# Patient Record
Sex: Female | Born: 1950
Health system: Southern US, Community
[De-identification: ages and names within clinical notes are randomized; demographics above are authoritative.]

## PROBLEM LIST (undated history)

## (undated) DIAGNOSIS — N841 Polyp of cervix uteri: Secondary | ICD-10-CM

## (undated) DIAGNOSIS — M65331 Trigger finger, right middle finger: Secondary | ICD-10-CM

## (undated) DIAGNOSIS — M65341 Trigger finger, right ring finger: Secondary | ICD-10-CM

## (undated) DIAGNOSIS — Z87898 Personal history of other specified conditions: Secondary | ICD-10-CM

## (undated) DIAGNOSIS — M65351 Trigger finger, right little finger: Secondary | ICD-10-CM

## (undated) DIAGNOSIS — Z9889 Other specified postprocedural states: Secondary | ICD-10-CM

## (undated) DIAGNOSIS — N979 Female infertility, unspecified: Secondary | ICD-10-CM

## (undated) DIAGNOSIS — Z9882 Breast implant status: Secondary | ICD-10-CM

## (undated) DIAGNOSIS — M65321 Trigger finger, right index finger: Secondary | ICD-10-CM

## (undated) DIAGNOSIS — T4145XA Adverse effect of unspecified anesthetic, initial encounter: Secondary | ICD-10-CM

## (undated) DIAGNOSIS — D259 Leiomyoma of uterus, unspecified: Secondary | ICD-10-CM

## (undated) DIAGNOSIS — Z8619 Personal history of other infectious and parasitic diseases: Secondary | ICD-10-CM

## (undated) DIAGNOSIS — N951 Menopausal and female climacteric states: Secondary | ICD-10-CM

## (undated) DIAGNOSIS — M1990 Unspecified osteoarthritis, unspecified site: Secondary | ICD-10-CM

## (undated) DIAGNOSIS — M65311 Trigger thumb, right thumb: Secondary | ICD-10-CM

## (undated) DIAGNOSIS — T8859XA Other complications of anesthesia, initial encounter: Secondary | ICD-10-CM

## (undated) DIAGNOSIS — N879 Dysplasia of cervix uteri, unspecified: Secondary | ICD-10-CM

## (undated) HISTORY — PX: INTRAOCULAR LENS INSERTION: SHX110

## (undated) HISTORY — DX: Dysplasia of cervix uteri, unspecified: N87.9

## (undated) HISTORY — DX: Unspecified osteoarthritis, unspecified site: M19.90

## (undated) HISTORY — PX: UTERINE FIBROID SURGERY: SHX826

## (undated) HISTORY — DX: Menopausal and female climacteric states: N95.1

## (undated) HISTORY — DX: Polyp of cervix uteri: N84.1

## (undated) HISTORY — PX: MOUTH SURGERY: SHX715

## (undated) HISTORY — DX: Leiomyoma of uterus, unspecified: D25.9

## (undated) HISTORY — DX: Personal history of other infectious and parasitic diseases: Z86.19

## (undated) HISTORY — DX: Personal history of other specified conditions: Z87.898

## (undated) HISTORY — PX: TUBAL LIGATION: SHX77

## (undated) HISTORY — DX: Female infertility, unspecified: N97.9

## (undated) HISTORY — PX: ABDOMINAL HYSTERECTOMY: SHX81

---

## 1996-04-15 HISTORY — PX: BREAST SURGERY: SHX581

## 1997-07-27 ENCOUNTER — Other Ambulatory Visit: Admission: RE | Admit: 1997-07-27 | Discharge: 1997-07-27 | Payer: Self-pay | Admitting: *Deleted

## 1998-08-07 ENCOUNTER — Other Ambulatory Visit: Admission: RE | Admit: 1998-08-07 | Discharge: 1998-08-07 | Payer: Self-pay | Admitting: *Deleted

## 1999-10-24 ENCOUNTER — Other Ambulatory Visit: Admission: RE | Admit: 1999-10-24 | Discharge: 1999-10-24 | Payer: Self-pay | Admitting: *Deleted

## 2000-10-02 ENCOUNTER — Other Ambulatory Visit: Admission: RE | Admit: 2000-10-02 | Discharge: 2000-10-02 | Payer: Self-pay | Admitting: *Deleted

## 2001-10-12 ENCOUNTER — Other Ambulatory Visit: Admission: RE | Admit: 2001-10-12 | Discharge: 2001-10-12 | Payer: Self-pay | Admitting: *Deleted

## 2002-12-23 ENCOUNTER — Other Ambulatory Visit: Admission: RE | Admit: 2002-12-23 | Discharge: 2002-12-23 | Payer: Self-pay | Admitting: *Deleted

## 2004-02-21 ENCOUNTER — Emergency Department (HOSPITAL_COMMUNITY): Admission: EM | Admit: 2004-02-21 | Discharge: 2004-02-21 | Payer: Self-pay | Admitting: Emergency Medicine

## 2004-11-22 ENCOUNTER — Other Ambulatory Visit: Admission: RE | Admit: 2004-11-22 | Discharge: 2004-11-22 | Payer: Self-pay | Admitting: Obstetrics and Gynecology

## 2004-12-13 ENCOUNTER — Encounter: Admission: RE | Admit: 2004-12-13 | Discharge: 2004-12-13 | Payer: Self-pay | Admitting: Obstetrics and Gynecology

## 2005-09-26 ENCOUNTER — Encounter: Admission: RE | Admit: 2005-09-26 | Discharge: 2005-09-26 | Payer: Self-pay

## 2006-01-08 ENCOUNTER — Encounter: Admission: RE | Admit: 2006-01-08 | Discharge: 2006-01-08 | Payer: Self-pay | Admitting: Obstetrics and Gynecology

## 2007-01-12 ENCOUNTER — Encounter: Admission: RE | Admit: 2007-01-12 | Discharge: 2007-01-12 | Payer: Self-pay | Admitting: Family Medicine

## 2008-01-13 ENCOUNTER — Encounter: Admission: RE | Admit: 2008-01-13 | Discharge: 2008-01-13 | Payer: Self-pay | Admitting: Obstetrics and Gynecology

## 2008-01-14 DIAGNOSIS — N841 Polyp of cervix uteri: Secondary | ICD-10-CM

## 2008-01-14 HISTORY — DX: Polyp of cervix uteri: N84.1

## 2009-07-24 ENCOUNTER — Encounter: Admission: RE | Admit: 2009-07-24 | Discharge: 2009-07-24 | Payer: Self-pay | Admitting: Obstetrics and Gynecology

## 2009-09-04 ENCOUNTER — Encounter: Admission: RE | Admit: 2009-09-04 | Discharge: 2009-09-04 | Payer: Self-pay | Admitting: Obstetrics and Gynecology

## 2009-09-13 ENCOUNTER — Ambulatory Visit: Admission: RE | Admit: 2009-09-13 | Discharge: 2009-09-13 | Payer: Self-pay | Admitting: Gynecologic Oncology

## 2009-09-19 ENCOUNTER — Inpatient Hospital Stay (HOSPITAL_COMMUNITY): Admission: RE | Admit: 2009-09-19 | Discharge: 2009-09-22 | Payer: Self-pay | Admitting: Obstetrics & Gynecology

## 2009-09-19 ENCOUNTER — Encounter: Payer: Self-pay | Admitting: Obstetrics & Gynecology

## 2009-09-19 HISTORY — PX: SALPINGOOPHORECTOMY: SHX82

## 2009-09-26 ENCOUNTER — Inpatient Hospital Stay (HOSPITAL_COMMUNITY): Admission: EM | Admit: 2009-09-26 | Discharge: 2009-10-06 | Payer: Self-pay | Admitting: Emergency Medicine

## 2010-05-06 ENCOUNTER — Encounter: Payer: Self-pay | Admitting: Obstetrics and Gynecology

## 2010-07-01 LAB — BASIC METABOLIC PANEL
BUN: 1 mg/dL — ABNORMAL LOW (ref 6–23)
BUN: 2 mg/dL — ABNORMAL LOW (ref 6–23)
BUN: 2 mg/dL — ABNORMAL LOW (ref 6–23)
BUN: 3 mg/dL — ABNORMAL LOW (ref 6–23)
BUN: 5 mg/dL — ABNORMAL LOW (ref 6–23)
BUN: 6 mg/dL (ref 6–23)
CO2: 27 mEq/L (ref 19–32)
CO2: 27 mEq/L (ref 19–32)
CO2: 27 mEq/L (ref 19–32)
CO2: 28 mEq/L (ref 19–32)
CO2: 30 mEq/L (ref 19–32)
CO2: 30 mEq/L (ref 19–32)
Calcium: 9.1 mg/dL (ref 8.4–10.5)
Calcium: 9.2 mg/dL (ref 8.4–10.5)
Calcium: 9.4 mg/dL (ref 8.4–10.5)
Calcium: 9.5 mg/dL (ref 8.4–10.5)
Calcium: 9.5 mg/dL (ref 8.4–10.5)
Calcium: 9.7 mg/dL (ref 8.4–10.5)
Chloride: 101 mEq/L (ref 96–112)
Chloride: 101 mEq/L (ref 96–112)
Chloride: 103 mEq/L (ref 96–112)
Chloride: 103 mEq/L (ref 96–112)
Chloride: 106 mEq/L (ref 96–112)
Chloride: 98 mEq/L (ref 96–112)
Creatinine, Ser: 0.62 mg/dL (ref 0.4–1.2)
Creatinine, Ser: 0.7 mg/dL (ref 0.4–1.2)
Creatinine, Ser: 0.7 mg/dL (ref 0.4–1.2)
Creatinine, Ser: 0.71 mg/dL (ref 0.4–1.2)
Creatinine, Ser: 0.75 mg/dL (ref 0.4–1.2)
Creatinine, Ser: 0.77 mg/dL (ref 0.4–1.2)
GFR calc Af Amer: >60 mL/min (ref >60)
GFR calc Af Amer: >60 mL/min (ref >60)
GFR calc Af Amer: >60 mL/min (ref >60)
GFR calc Af Amer: >60 mL/min (ref >60)
GFR calc Af Amer: >60 mL/min (ref >60)
GFR calc Af Amer: >60 mL/min (ref >60)
GFR calc non Af Amer: >60 mL/min (ref >60)
GFR calc non Af Amer: >60 mL/min (ref >60)
GFR calc non Af Amer: >60 mL/min (ref >60)
GFR calc non Af Amer: >60 mL/min (ref >60)
GFR calc non Af Amer: >60 mL/min (ref >60)
GFR calc non Af Amer: >60 mL/min (ref >60)
Glucose, Bld: 114 mg/dL — ABNORMAL HIGH (ref 70–99)
Glucose, Bld: 115 mg/dL — ABNORMAL HIGH (ref 70–99)
Glucose, Bld: 117 mg/dL — ABNORMAL HIGH (ref 70–99)
Glucose, Bld: 118 mg/dL — ABNORMAL HIGH (ref 70–99)
Glucose, Bld: 128 mg/dL — ABNORMAL HIGH (ref 70–99)
Glucose, Bld: 136 mg/dL — ABNORMAL HIGH (ref 70–99)
Potassium: 4 mEq/L (ref 3.5–5.1)
Potassium: 4 mEq/L (ref 3.5–5.1)
Potassium: 4.1 mEq/L (ref 3.5–5.1)
Potassium: 4.1 mEq/L (ref 3.5–5.1)
Potassium: 4.5 mEq/L (ref 3.5–5.1)
Potassium: 4.6 mEq/L (ref 3.5–5.1)
Sodium: 134 mEq/L — ABNORMAL LOW (ref 135–145)
Sodium: 136 mEq/L (ref 135–145)
Sodium: 137 mEq/L (ref 135–145)
Sodium: 138 mEq/L (ref 135–145)
Sodium: 138 mEq/L (ref 135–145)
Sodium: 138 mEq/L (ref 135–145)

## 2010-07-01 LAB — CBC
HCT: 33 % — ABNORMAL LOW (ref 36.0–46.0)
HCT: 33 % — ABNORMAL LOW (ref 36.0–46.0)
HCT: 34.1 % — ABNORMAL LOW (ref 36.0–46.0)
HCT: 34.7 % — ABNORMAL LOW (ref 36.0–46.0)
HCT: 34.8 % — ABNORMAL LOW (ref 36.0–46.0)
HCT: 35 % — ABNORMAL LOW (ref 36.0–46.0)
HCT: 35.2 % — ABNORMAL LOW (ref 36.0–46.0)
Hemoglobin: 10.8 g/dL — ABNORMAL LOW (ref 12.0–15.0)
Hemoglobin: 10.9 g/dL — ABNORMAL LOW (ref 12.0–15.0)
Hemoglobin: 11.2 g/dL — ABNORMAL LOW (ref 12.0–15.0)
Hemoglobin: 11.2 g/dL — ABNORMAL LOW (ref 12.0–15.0)
Hemoglobin: 11.3 g/dL — ABNORMAL LOW (ref 12.0–15.0)
Hemoglobin: 11.5 g/dL — ABNORMAL LOW (ref 12.0–15.0)
Hemoglobin: 11.6 g/dL — ABNORMAL LOW (ref 12.0–15.0)
MCHC: 31.9 g/dL (ref 30.0–36.0)
MCHC: 32.1 g/dL (ref 30.0–36.0)
MCHC: 32.3 g/dL (ref 30.0–36.0)
MCHC: 32.6 g/dL (ref 30.0–36.0)
MCHC: 32.6 g/dL (ref 30.0–36.0)
MCHC: 32.7 g/dL (ref 30.0–36.0)
MCHC: 35 g/dL (ref 30.0–36.0)
MCV: 84.7 fL (ref 78.0–100.0)
MCV: 86.5 fL (ref 78.0–100.0)
MCV: 86.7 fL (ref 78.0–100.0)
MCV: 86.9 fL (ref 78.0–100.0)
MCV: 87 fL (ref 78.0–100.0)
MCV: 87.7 fL (ref 78.0–100.0)
MCV: 88 fL (ref 78.0–100.0)
Platelets: 282 10*3/uL (ref 150–400)
Platelets: 288 10*3/uL (ref 150–400)
Platelets: 292 10*3/uL (ref 150–400)
Platelets: 307 10*3/uL (ref 150–400)
Platelets: 316 10*3/uL (ref 150–400)
Platelets: 339 10*3/uL (ref 150–400)
Platelets: 347 10*3/uL (ref 150–400)
RBC: 3.81 MIL/uL — ABNORMAL LOW (ref 3.87–5.11)
RBC: 3.89 MIL/uL (ref 3.87–5.11)
RBC: 3.9 MIL/uL (ref 3.87–5.11)
RBC: 3.98 MIL/uL (ref 3.87–5.11)
RBC: 3.99 MIL/uL (ref 3.87–5.11)
RBC: 4 MIL/uL (ref 3.87–5.11)
RBC: 4.07 MIL/uL (ref 3.87–5.11)
RDW: 12 % (ref 11.5–15.5)
RDW: 12.9 % (ref 11.5–15.5)
RDW: 13 % (ref 11.5–15.5)
RDW: 13 % (ref 11.5–15.5)
RDW: 13.1 % (ref 11.5–15.5)
RDW: 13.2 % (ref 11.5–15.5)
RDW: 13.5 % (ref 11.5–15.5)
WBC: 4 10*3/uL (ref 4.0–10.5)
WBC: 4.4 10*3/uL (ref 4.0–10.5)
WBC: 4.4 10*3/uL (ref 4.0–10.5)
WBC: 5.2 10*3/uL (ref 4.0–10.5)
WBC: 5.2 10*3/uL (ref 4.0–10.5)
WBC: 6.8 10*3/uL (ref 4.0–10.5)
WBC: 7.1 10*3/uL (ref 4.0–10.5)

## 2010-07-01 LAB — COMPREHENSIVE METABOLIC PANEL
ALT: 13 U/L (ref 0–35)
ALT: 14 U/L (ref 0–35)
AST: 15 U/L (ref 0–37)
AST: 18 U/L (ref 0–37)
Albumin: 3.2 g/dL — ABNORMAL LOW (ref 3.5–5.2)
Albumin: 3.2 g/dL — ABNORMAL LOW (ref 3.5–5.2)
Alkaline Phosphatase: 43 U/L (ref 39–117)
Alkaline Phosphatase: 43 U/L (ref 39–117)
BUN: 2 mg/dL — ABNORMAL LOW (ref 6–23)
BUN: 9 mg/dL (ref 6–23)
CO2: 28 mEq/L (ref 19–32)
CO2: 30 mEq/L (ref 19–32)
Calcium: 9.1 mg/dL (ref 8.4–10.5)
Calcium: 9.2 mg/dL (ref 8.4–10.5)
Chloride: 101 mEq/L (ref 96–112)
Chloride: 102 mEq/L (ref 96–112)
Creatinine, Ser: 0.6 mg/dL (ref 0.4–1.2)
Creatinine, Ser: 0.78 mg/dL (ref 0.4–1.2)
GFR calc Af Amer: >60 mL/min (ref >60)
GFR calc Af Amer: >60 mL/min (ref >60)
GFR calc non Af Amer: >60 mL/min (ref >60)
GFR calc non Af Amer: >60 mL/min (ref >60)
Glucose, Bld: 118 mg/dL — ABNORMAL HIGH (ref 70–99)
Glucose, Bld: 140 mg/dL — ABNORMAL HIGH (ref 70–99)
Potassium: 4 mEq/L (ref 3.5–5.1)
Potassium: 4.1 mEq/L (ref 3.5–5.1)
Sodium: 136 mEq/L (ref 135–145)
Sodium: 137 mEq/L (ref 135–145)
Total Bilirubin: 0.2 mg/dL — ABNORMAL LOW (ref 0.3–1.2)
Total Bilirubin: 0.7 mg/dL (ref 0.3–1.2)
Total Protein: 6.2 g/dL (ref 6.0–8.3)
Total Protein: 6.3 g/dL (ref 6.0–8.3)

## 2010-07-01 LAB — TYPE AND SCREEN
ABO/RH(D): A POS
Antibody Screen: NEGATIVE

## 2010-07-02 LAB — DIFFERENTIAL
Basophils Absolute: 0 10*3/uL (ref 0.0–0.1)
Basophils Absolute: 0 10*3/uL (ref 0.0–0.1)
Basophils Relative: 0 % (ref 0–1)
Basophils Relative: 0 % (ref 0–1)
Eosinophils Absolute: 0 10*3/uL (ref 0.0–0.7)
Eosinophils Absolute: 0 10*3/uL (ref 0.0–0.7)
Eosinophils Relative: 0 % (ref 0–5)
Eosinophils Relative: 1 % (ref 0–5)
Lymphocytes Relative: 13 % (ref 12–46)
Lymphocytes Relative: 24 % (ref 12–46)
Lymphs Abs: 0.6 10*3/uL — ABNORMAL LOW (ref 0.7–4.0)
Lymphs Abs: 1.1 10*3/uL (ref 0.7–4.0)
Monocytes Absolute: 0.3 10*3/uL (ref 0.1–1.0)
Monocytes Absolute: 0.7 10*3/uL (ref 0.1–1.0)
Monocytes Relative: 15 % — ABNORMAL HIGH (ref 3–12)
Monocytes Relative: 5 % (ref 3–12)
Neutro Abs: 3.3 10*3/uL (ref 1.7–7.7)
Neutro Abs: 3.4 10*3/uL (ref 1.7–7.7)
Neutrophils Relative %: 70 % (ref 43–77)
Neutrophils Relative %: 71 % (ref 43–77)

## 2010-07-02 LAB — CBC
HCT: 34 % — ABNORMAL LOW (ref 36.0–46.0)
HCT: 39 % (ref 36.0–46.0)
HCT: 39.6 % (ref 36.0–46.0)
Hemoglobin: 11.2 g/dL — ABNORMAL LOW (ref 12.0–15.0)
Hemoglobin: 12.9 g/dL (ref 12.0–15.0)
Hemoglobin: 13 g/dL (ref 12.0–15.0)
MCHC: 32.5 g/dL (ref 30.0–36.0)
MCHC: 32.9 g/dL (ref 30.0–36.0)
MCHC: 33.3 g/dL (ref 30.0–36.0)
MCV: 87.4 fL (ref 78.0–100.0)
MCV: 88.1 fL (ref 78.0–100.0)
MCV: 89 fL (ref 78.0–100.0)
Platelets: 227 10*3/uL (ref 150–400)
Platelets: 228 10*3/uL (ref 150–400)
Platelets: 318 10*3/uL (ref 150–400)
RBC: 3.82 MIL/uL — ABNORMAL LOW (ref 3.87–5.11)
RBC: 4.43 MIL/uL (ref 3.87–5.11)
RBC: 4.53 MIL/uL (ref 3.87–5.11)
RDW: 13 % (ref 11.5–15.5)
RDW: 13.6 % (ref 11.5–15.5)
RDW: 13.7 % (ref 11.5–15.5)
WBC: 4.6 10*3/uL (ref 4.0–10.5)
WBC: 4.8 10*3/uL (ref 4.0–10.5)
WBC: 7.1 10*3/uL (ref 4.0–10.5)

## 2010-07-02 LAB — URINALYSIS, ROUTINE W REFLEX MICROSCOPIC
Glucose, UA: NEGATIVE mg/dL
Ketones, ur: >80 mg/dL — AB
Leukocytes, UA: NEGATIVE
Nitrite: NEGATIVE
Protein, ur: 100 mg/dL — AB
Specific Gravity, Urine: 1.034 — ABNORMAL HIGH (ref 1.005–1.030)
Urobilinogen, UA: 1 mg/dL (ref 0.0–1.0)
pH: 5.5 (ref 5.0–8.0)

## 2010-07-02 LAB — COMPREHENSIVE METABOLIC PANEL
ALT: 16 U/L (ref 0–35)
ALT: 18 U/L (ref 0–35)
AST: 20 U/L (ref 0–37)
AST: 28 U/L (ref 0–37)
Albumin: 4 g/dL (ref 3.5–5.2)
Albumin: 4.2 g/dL (ref 3.5–5.2)
Alkaline Phosphatase: 43 U/L (ref 39–117)
Alkaline Phosphatase: 53 U/L (ref 39–117)
BUN: 10 mg/dL (ref 6–23)
BUN: 8 mg/dL (ref 6–23)
CO2: 28 mEq/L (ref 19–32)
CO2: 30 mEq/L (ref 19–32)
Calcium: 10 mg/dL (ref 8.4–10.5)
Calcium: 10.1 mg/dL (ref 8.4–10.5)
Chloride: 107 mEq/L (ref 96–112)
Chloride: 98 mEq/L (ref 96–112)
Creatinine, Ser: 0.76 mg/dL (ref 0.4–1.2)
Creatinine, Ser: 0.9 mg/dL (ref 0.4–1.2)
GFR calc Af Amer: >60 mL/min (ref >60)
GFR calc Af Amer: >60 mL/min (ref >60)
GFR calc non Af Amer: >60 mL/min (ref >60)
GFR calc non Af Amer: >60 mL/min (ref >60)
Glucose, Bld: 100 mg/dL — ABNORMAL HIGH (ref 70–99)
Glucose, Bld: 71 mg/dL (ref 70–99)
Potassium: 4.3 mEq/L (ref 3.5–5.1)
Potassium: 4.4 mEq/L (ref 3.5–5.1)
Sodium: 137 mEq/L (ref 135–145)
Sodium: 143 mEq/L (ref 135–145)
Total Bilirubin: 0.5 mg/dL (ref 0.3–1.2)
Total Bilirubin: 0.6 mg/dL (ref 0.3–1.2)
Total Protein: 7.4 g/dL (ref 6.0–8.3)
Total Protein: 7.6 g/dL (ref 6.0–8.3)

## 2010-07-02 LAB — TYPE AND SCREEN
ABO/RH(D): A POS
Antibody Screen: NEGATIVE

## 2010-07-02 LAB — BASIC METABOLIC PANEL
BUN: 4 mg/dL — ABNORMAL LOW (ref 6–23)
CO2: 27 mEq/L (ref 19–32)
Calcium: 8.8 mg/dL (ref 8.4–10.5)
Chloride: 106 mEq/L (ref 96–112)
Creatinine, Ser: 0.77 mg/dL (ref 0.4–1.2)
GFR calc Af Amer: >60 mL/min (ref >60)
GFR calc non Af Amer: >60 mL/min (ref >60)
Glucose, Bld: 125 mg/dL — ABNORMAL HIGH (ref 70–99)
Potassium: 4.7 mEq/L (ref 3.5–5.1)
Sodium: 139 mEq/L (ref 135–145)

## 2010-07-02 LAB — URINE MICROSCOPIC-ADD ON

## 2010-07-02 LAB — LIPASE, BLOOD: Lipase: 85 U/L — ABNORMAL HIGH (ref 11–59)

## 2010-07-02 LAB — ABO/RH: ABO/RH(D): A POS

## 2010-08-13 ENCOUNTER — Other Ambulatory Visit: Payer: Self-pay | Admitting: Obstetrics and Gynecology

## 2010-08-13 DIAGNOSIS — Z1231 Encounter for screening mammogram for malignant neoplasm of breast: Secondary | ICD-10-CM

## 2010-08-15 ENCOUNTER — Ambulatory Visit
Admission: RE | Admit: 2010-08-15 | Discharge: 2010-08-15 | Disposition: A | Discharge status: Home / Self Care | Payer: BC Managed Care – PPO | Source: Ambulatory Visit | Attending: Obstetrics and Gynecology | Admitting: Obstetrics and Gynecology

## 2010-08-15 DIAGNOSIS — Z1231 Encounter for screening mammogram for malignant neoplasm of breast: Secondary | ICD-10-CM

## 2010-09-12 ENCOUNTER — Ambulatory Visit (AMBULATORY_SURGERY_CENTER): Payer: BC Managed Care – PPO | Admitting: *Deleted

## 2010-09-12 ENCOUNTER — Encounter: Payer: Self-pay | Admitting: Gastroenterology

## 2010-09-12 VITALS — Ht 66.5 in | Wt 164.8 lb

## 2010-09-12 DIAGNOSIS — Z1211 Encounter for screening for malignant neoplasm of colon: Secondary | ICD-10-CM

## 2010-09-12 MED ORDER — PEG-KCL-NACL-NASULF-NA ASC-C 100 G PO SOLR: ORAL | Status: DC

## 2010-09-18 ENCOUNTER — Ambulatory Visit (AMBULATORY_SURGERY_CENTER): Payer: BC Managed Care – PPO | Admitting: Gastroenterology

## 2010-09-18 ENCOUNTER — Encounter: Payer: Self-pay | Admitting: Gastroenterology

## 2010-09-18 VITALS — BP 113/67 | HR 51 | Temp 95.9°F | Resp 14 | Ht 67.5 in | Wt 162.0 lb

## 2010-09-18 DIAGNOSIS — K5730 Diverticulosis of large intestine without perforation or abscess without bleeding: Secondary | ICD-10-CM

## 2010-09-18 DIAGNOSIS — Z139 Encounter for screening, unspecified: Secondary | ICD-10-CM

## 2010-09-18 DIAGNOSIS — Z1211 Encounter for screening for malignant neoplasm of colon: Secondary | ICD-10-CM

## 2010-09-18 MED ORDER — SODIUM CHLORIDE 0.9 % IV SOLN: 500.0000 mL | INTRAVENOUS | Status: DC

## 2010-09-18 NOTE — Patient Instructions (Signed)
Diverticulosis Diverticulosis is a common condition that develops when small pouches (diverticula) form in the wall of the colon. The risk of diverticulosis increases with age. It happens more often in people who eat a low-fiber diet. Most individuals with diverticulosis have no symptoms. Those individuals with symptoms usually experience belly (abdominal) pain, constipation, or loose stools (diarrhea). HOME CARE INSTRUCTIONS  Increase the amount of fiber in your diet as directed by your caregiver or dietician. This may reduce symptoms of diverticulosis.   Your caregiver may recommend taking a dietary fiber supplement.   Drink at least 6 to 8 glasses of water each day to prevent constipation.   Try not to strain when you have a bowel movement.   Your caregiver may recommend avoiding nuts and seeds to prevent complications, although this is still an uncertain benefit.   Only take over-the-counter or prescription medicines for pain, discomfort, or fever as directed by your caregiver.  FOODS HAVING HIGH FIBER CONTENT INCLUDE:  Fruits. Apple, peach, pear, tangerine, raisins, prunes.   Vegetables. Brussels sprouts, asparagus, broccoli, cabbage, carrot, cauliflower, romaine lettuce, spinach, summer squash, tomato, winter squash, zucchini.   Starchy Vegetables. Baked beans, kidney beans, lima beans, split peas, lentils, potatoes (with skin).   Grains. Whole wheat bread, brown rice, bran flake cereal, plain oatmeal, white rice, shredded wheat, bran muffins.  SEEK IMMEDIATE MEDICAL CARE IF:  You develop increasing pain or severe bloating.   You have an oral temperature above 100, not controlled by medicine.   You develop vomiting or bowel movements that are bloody or black.  Document Released: 12/28/2003 Document Re-Released: 09/19/2009 [employer] Patient Information 2011 [employer]. 

## 2010-09-18 NOTE — Progress Notes (Signed)
Discharged pt from monitor before printing vitals. BP 113-70---85/55, hr 48-55.

## 2010-09-19 ENCOUNTER — Telehealth: Payer: Self-pay | Admitting: *Deleted

## 2010-09-19 NOTE — Telephone Encounter (Signed)

## 2011-02-02 IMAGING — CR DG ABDOMEN 2V
2 series · 2 of 2 positions shown · non-contrast
Comparison: 10/01/2009

CLINICAL DATA: Small bowel obstruction

ABDOMEN - 2 VIEW

[w abdomen upright]
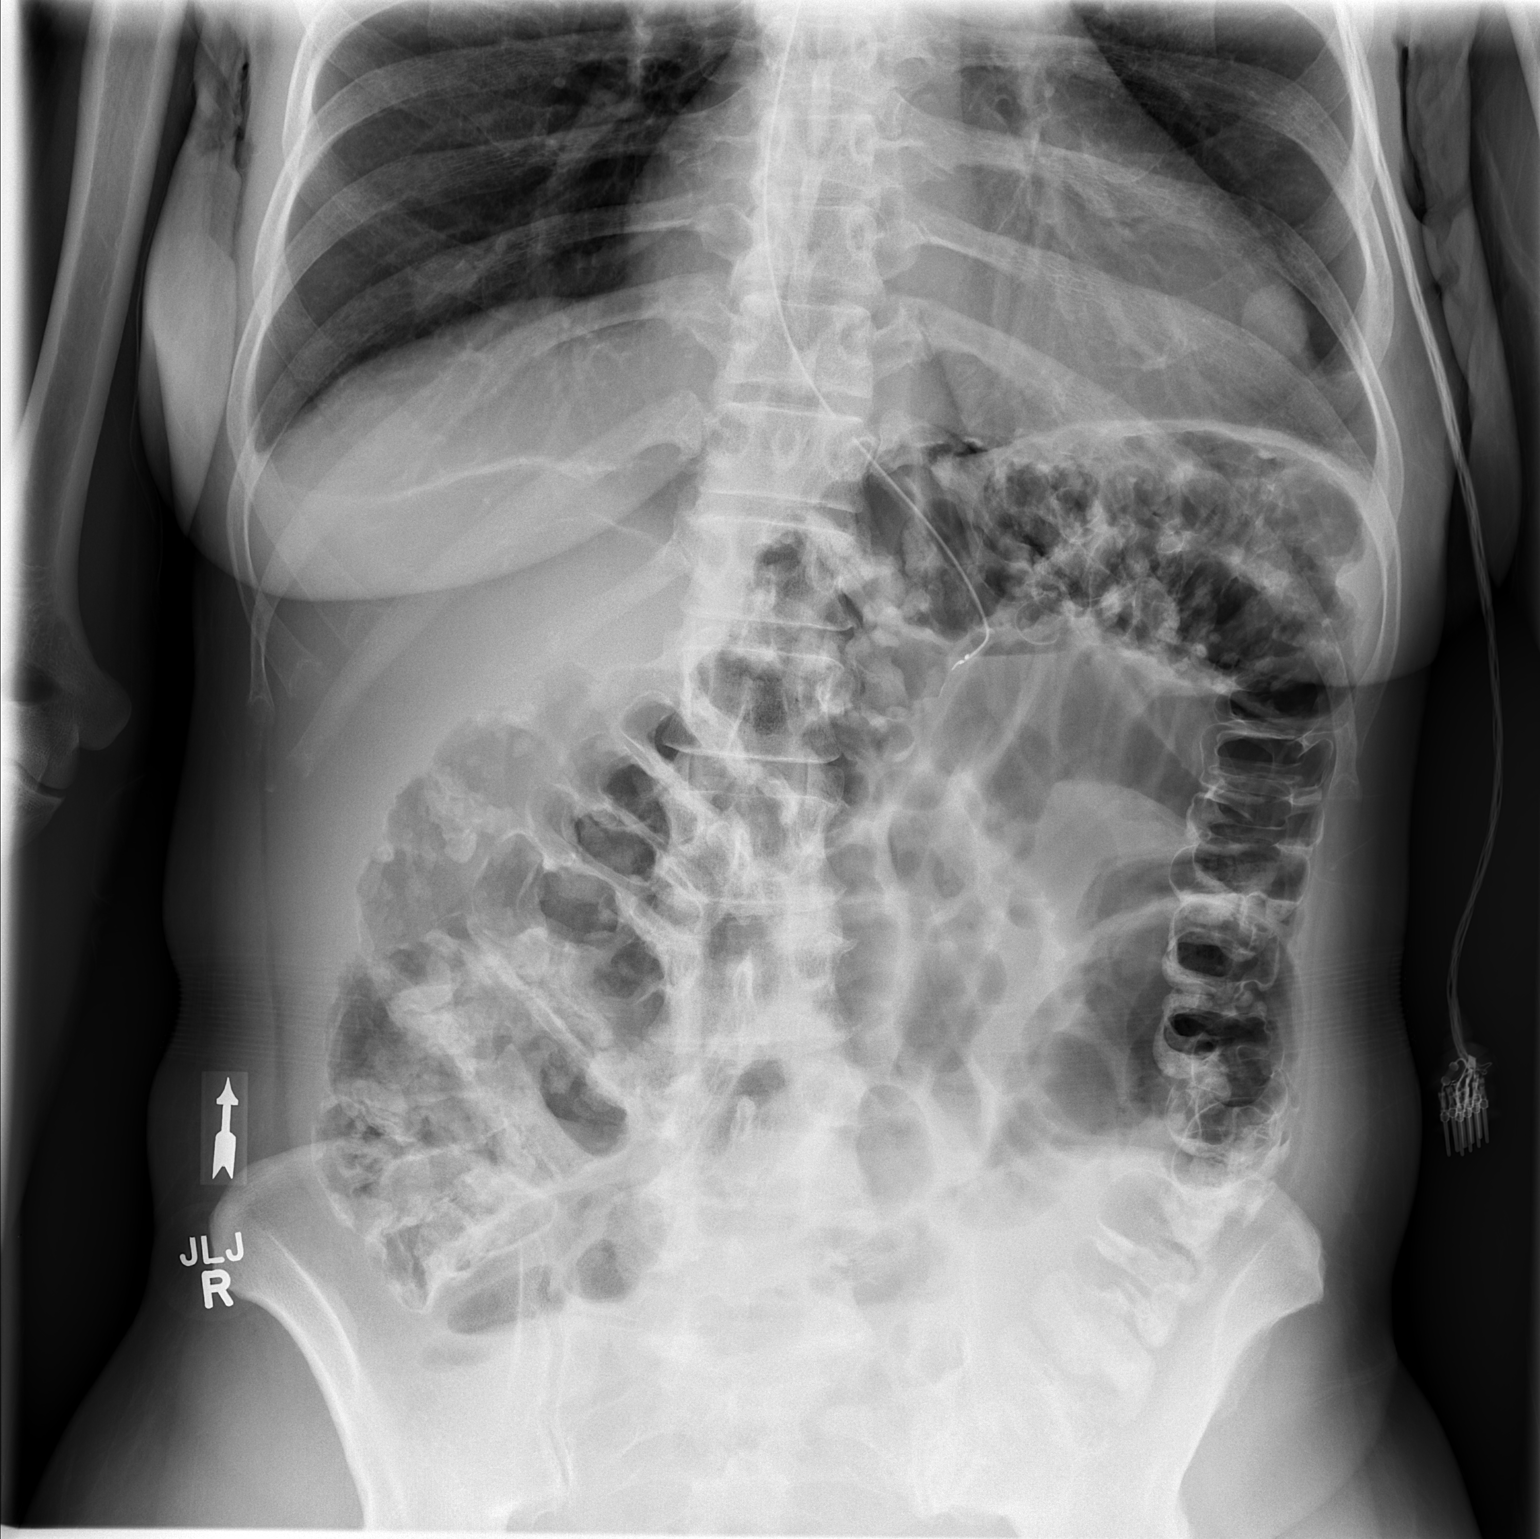

[t abdomen supine]
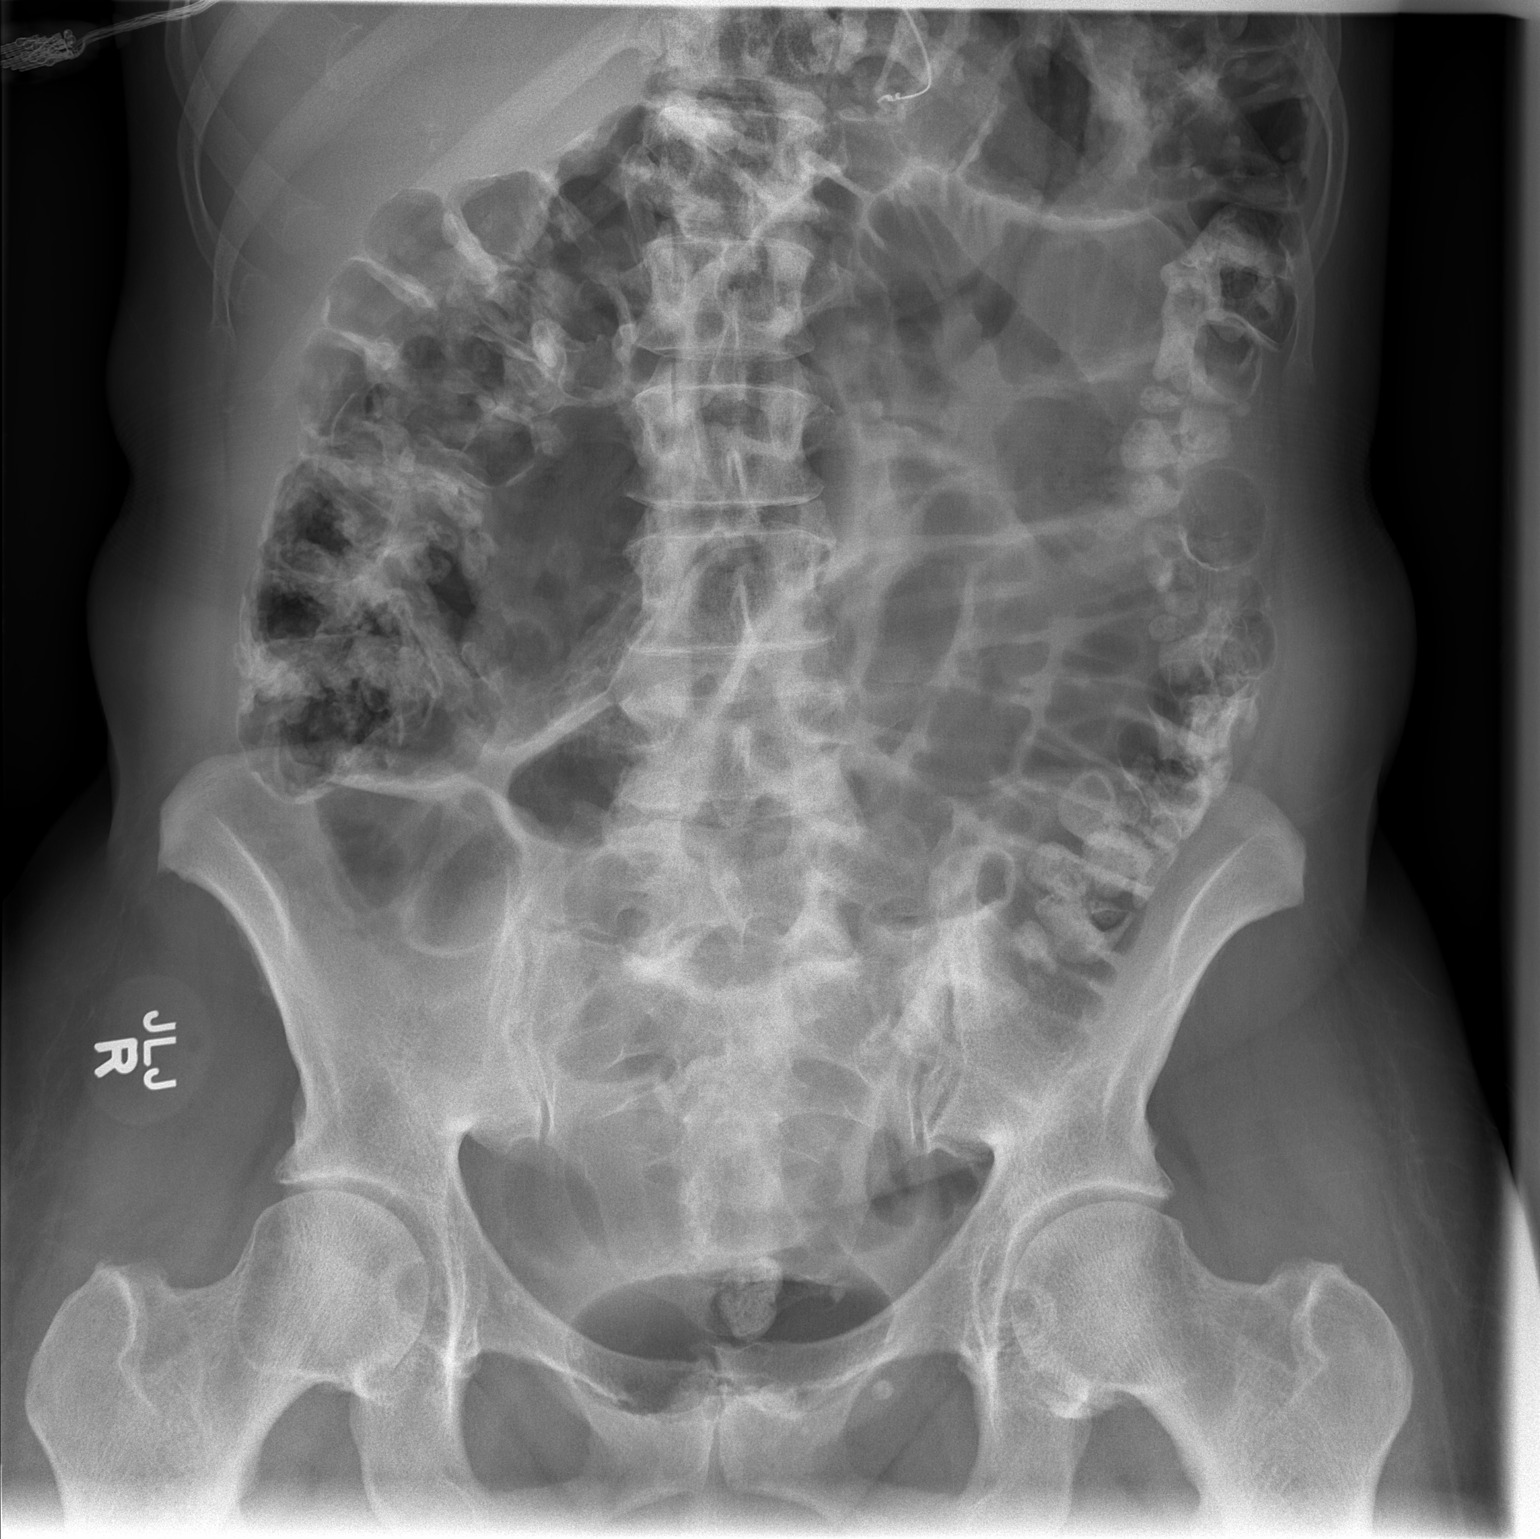

[2 of 2 positions shown; findings below may reference images not displayed]

FINDINGS: Stable NG tube position in the proximal stomach.
Persistent gaseous distention of small bowel.  Contrast and air
also present in the colon.  Interval improvement in the number of
air fluid levels on the upright exam.  No free air.
IMPRESSION: Improving small bowel obstruction pattern

## 2011-02-03 IMAGING — CR DG ABDOMEN 2V
3 series · 3 of 3 positions shown · non-contrast
Comparison: 10/02/2009.

CLINICAL DATA: Small-bowel obstruction.  Follow-up.

ABDOMEN - 2 VIEW

[w abdomen upright *]
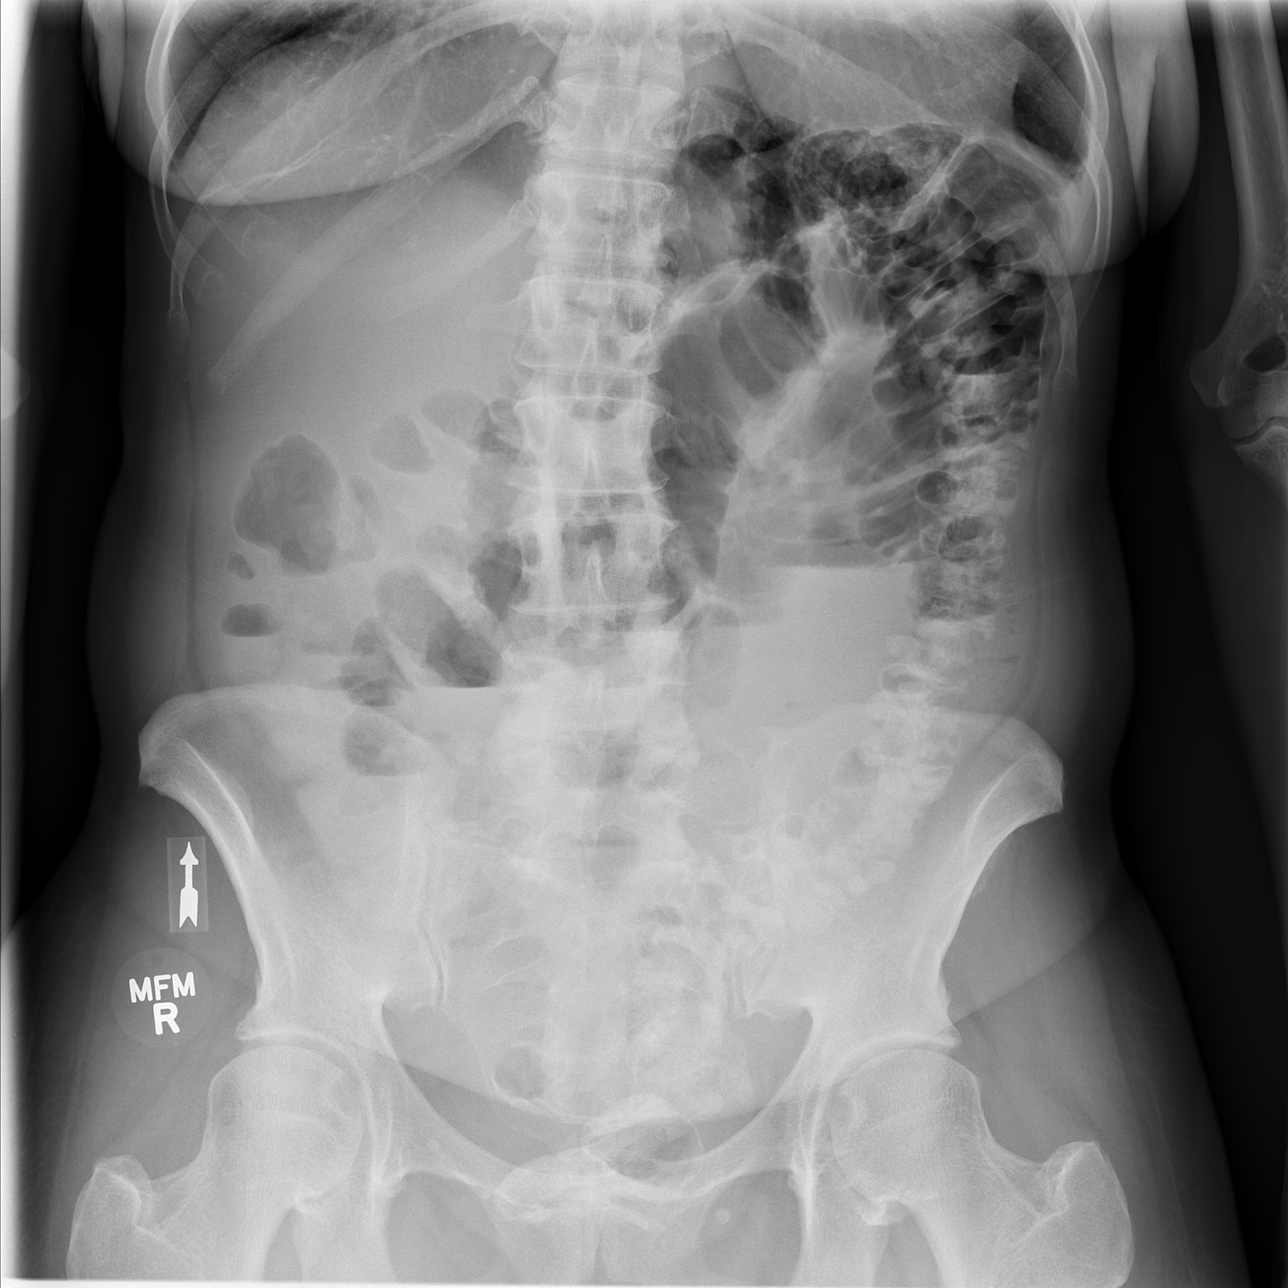

[w abdomen upright]
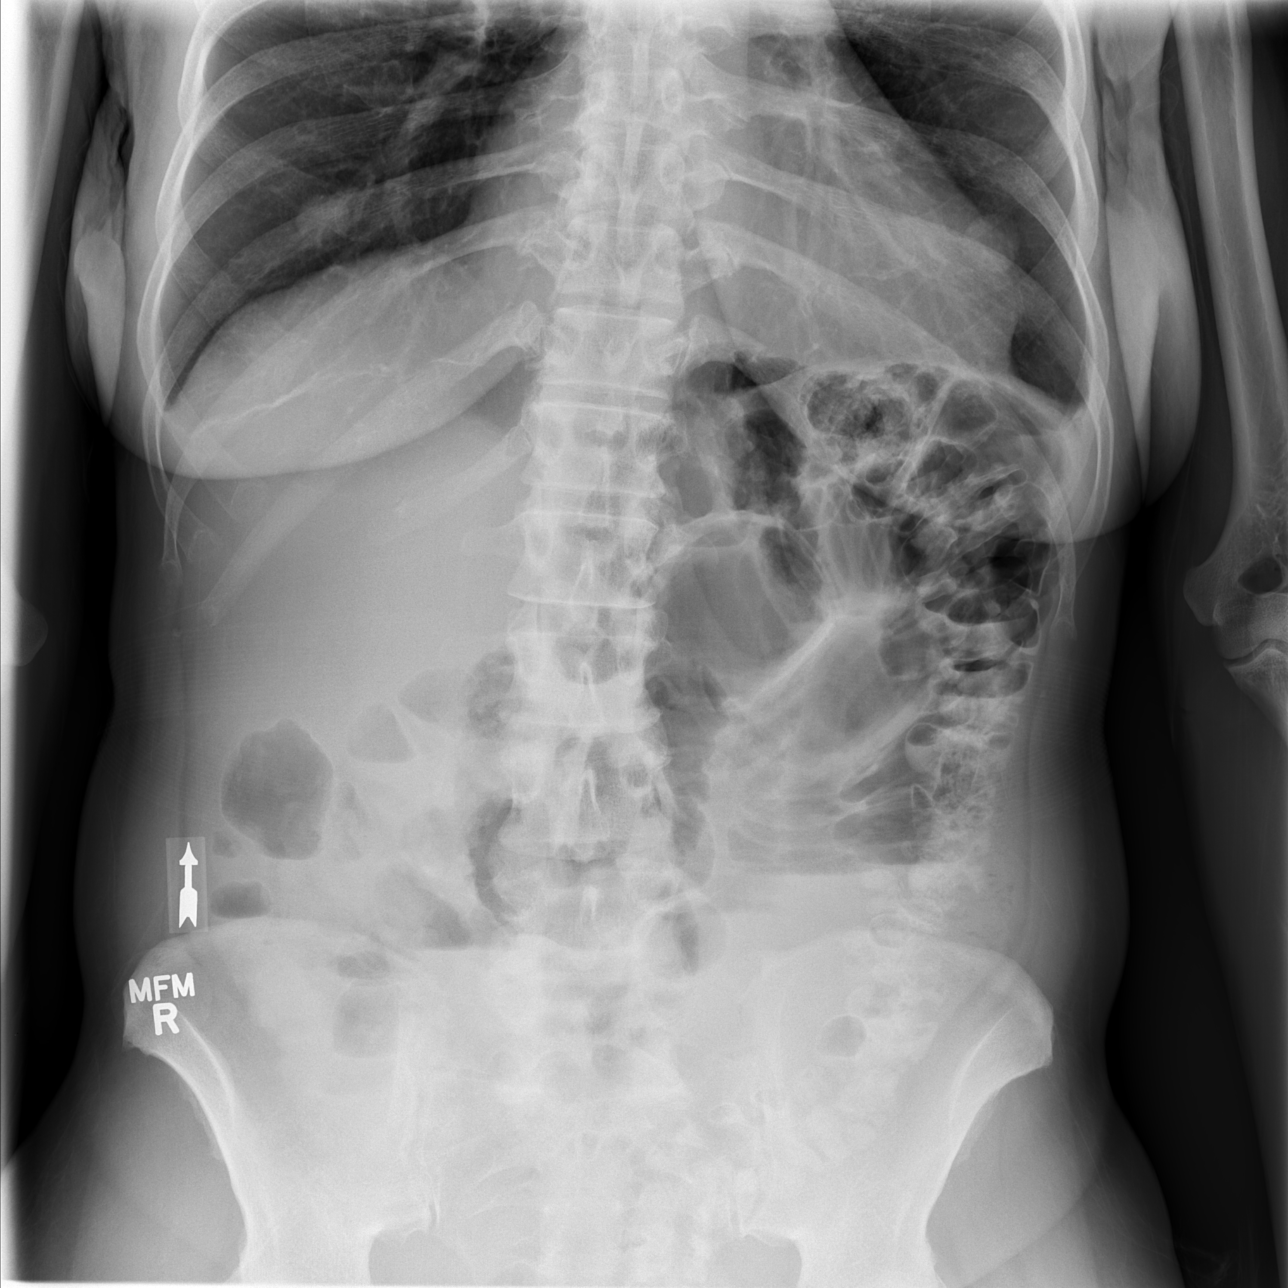

[t abdomen supine]
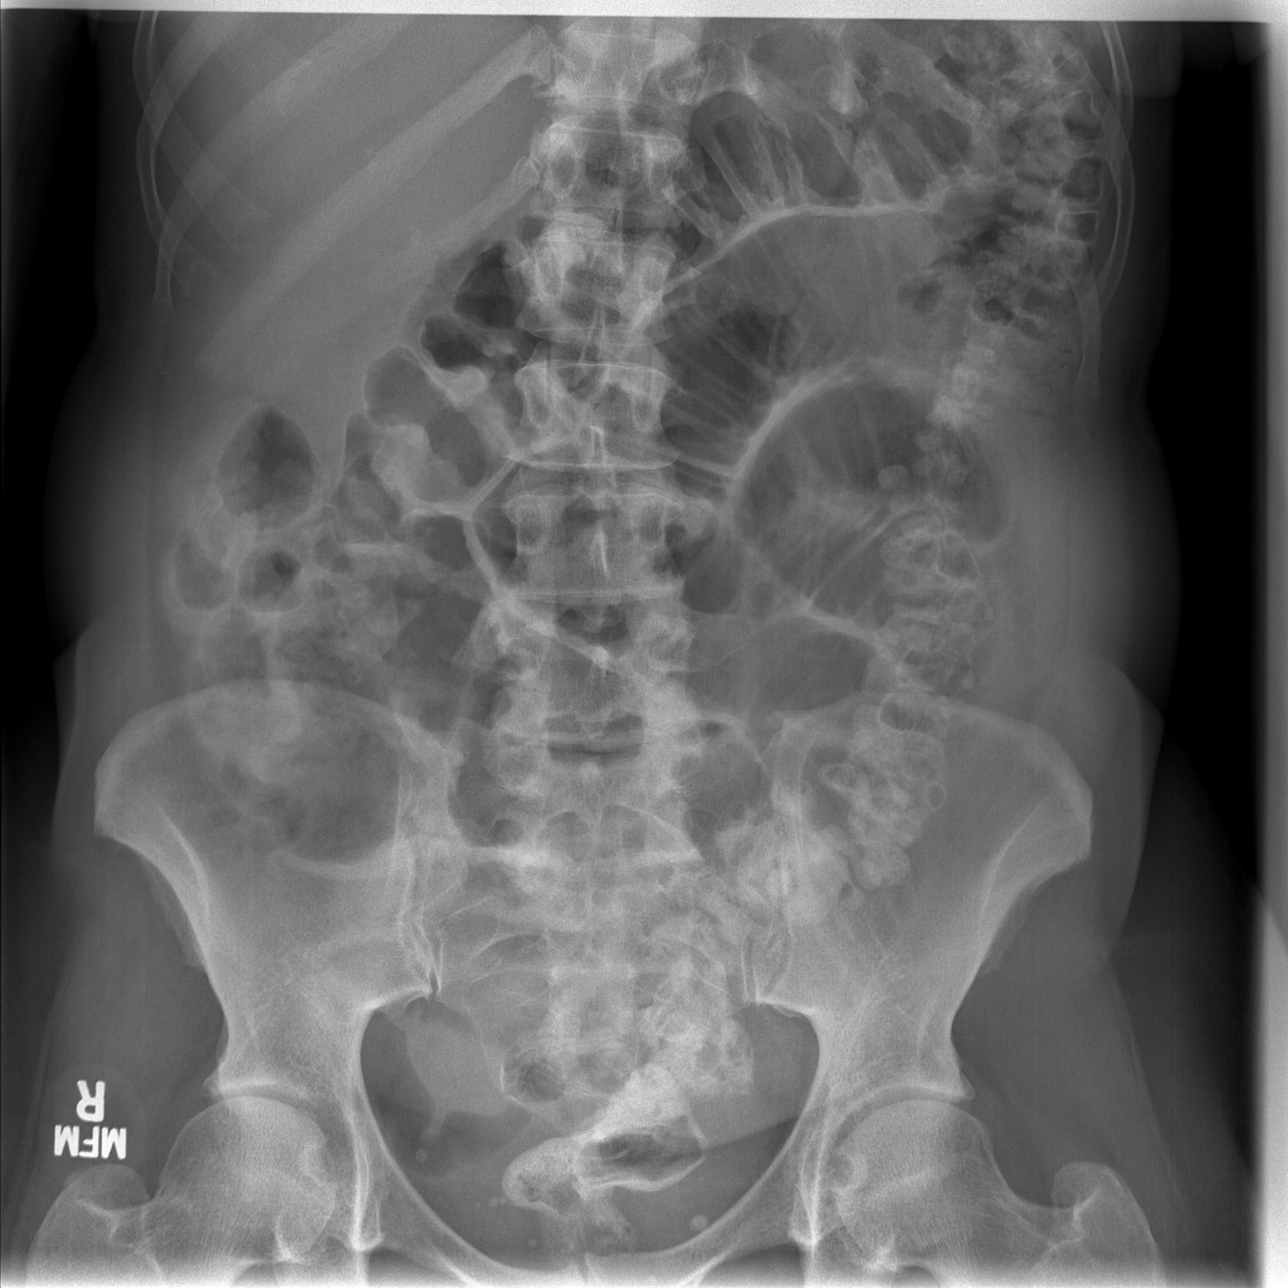

[3 of 3 positions shown; findings below may reference images not displayed]

FINDINGS: Since the previous examination the enteric tube has been
removed.  No pneumoperitoneum is evident.  Colon gas and contrast
is present.  Contrast and air is seen in the rectum.  There is
dilatation of multiple loops of small intestine with air-fluid
levels on upright examination.  The largest loop has a caliber of
approximately 5 cm.  Fewer loops appear distended on the current
examination.
IMPRESSION: Evidence of partial mechanical small-bowel obstruction.  Fewer
loops appear distended but there is still significant dilatation of
small bowel with air-fluid levels.  Interval removal of enteric
tube.  No pneumoperitoneum is evident.

## 2011-07-30 ENCOUNTER — Other Ambulatory Visit: Payer: Self-pay | Admitting: Obstetrics and Gynecology

## 2011-07-30 DIAGNOSIS — Z1231 Encounter for screening mammogram for malignant neoplasm of breast: Secondary | ICD-10-CM

## 2011-08-01 ENCOUNTER — Ambulatory Visit: Payer: Self-pay | Admitting: Obstetrics and Gynecology

## 2011-08-13 ENCOUNTER — Ambulatory Visit: Payer: Self-pay | Admitting: Obstetrics and Gynecology

## 2011-08-21 ENCOUNTER — Ambulatory Visit
Admission: RE | Admit: 2011-08-21 | Discharge: 2011-08-21 | Disposition: A | Discharge status: Home / Self Care | Payer: BC Managed Care – PPO | Source: Ambulatory Visit | Attending: Obstetrics and Gynecology | Admitting: Obstetrics and Gynecology

## 2011-08-21 DIAGNOSIS — Z1231 Encounter for screening mammogram for malignant neoplasm of breast: Secondary | ICD-10-CM

## 2011-08-30 ENCOUNTER — Ambulatory Visit (INDEPENDENT_AMBULATORY_CARE_PROVIDER_SITE_OTHER): Payer: BC Managed Care – PPO | Admitting: Obstetrics and Gynecology

## 2011-08-30 ENCOUNTER — Encounter: Payer: Self-pay | Admitting: Obstetrics and Gynecology

## 2011-08-30 VITALS — BP 100/64 | Ht 67.0 in | Wt 170.0 lb

## 2011-08-30 DIAGNOSIS — Z01419 Encounter for gynecological examination (general) (routine) without abnormal findings: Secondary | ICD-10-CM

## 2011-08-30 NOTE — Progress Notes (Signed)
Contraception Hyst Last pap 07/27/2010 wnl Last Colonoscopy /09/18/2010 Diverticulitis Last Dexa Scan 08/2009 wnl Primary MD none Abuse at Home none Last Mammo 08/2011 wnl  No complaints  Filed Vitals:   08/30/11 1518  BP: 100/64   ROS: noncontributory  Physical Examination: General appearance - alert, well appearing, and in no distress Neck - supple, no significant adenopathy Chest - clear to auscultation, no wheezes, rales or rhonchi, symmetric air entry Heart - normal rate and regular rhythm Abdomen - soft, nontender, nondistended, no masses or organomegaly Breasts - breasts appear normal, no suspicious masses, no skin or nipple changes or axillary nodes Pelvic - normal external genitalia, vulva, vagina, left adnexa Back exam - no CVAT Extremities - no edema, redness or tenderness in the calves or thighs  A/P Doing Well s/p TAH and RSO secondary to ovarian mass Had mammo Had colonoscopy last yr

## 2012-11-03 ENCOUNTER — Other Ambulatory Visit: Payer: Self-pay

## 2012-11-03 DIAGNOSIS — Z1231 Encounter for screening mammogram for malignant neoplasm of breast: Secondary | ICD-10-CM

## 2012-11-03 DIAGNOSIS — Z9889 Other specified postprocedural states: Secondary | ICD-10-CM

## 2012-11-18 ENCOUNTER — Ambulatory Visit
Admission: RE | Admit: 2012-11-18 | Discharge: 2012-11-18 | Disposition: A | Discharge status: Home / Self Care | Payer: BC Managed Care – PPO | Source: Ambulatory Visit

## 2012-11-18 DIAGNOSIS — Z9889 Other specified postprocedural states: Secondary | ICD-10-CM

## 2012-11-18 DIAGNOSIS — Z1231 Encounter for screening mammogram for malignant neoplasm of breast: Secondary | ICD-10-CM

## 2013-05-18 ENCOUNTER — Other Ambulatory Visit: Payer: Self-pay | Admitting: Family Medicine

## 2013-05-18 ENCOUNTER — Other Ambulatory Visit (HOSPITAL_COMMUNITY)
Admission: RE | Admit: 2013-05-18 | Discharge: 2013-05-18 | Disposition: A | Discharge status: Home / Self Care | Payer: BC Managed Care – PPO | Source: Ambulatory Visit | Attending: Family Medicine | Admitting: Family Medicine

## 2013-05-18 DIAGNOSIS — Z1151 Encounter for screening for human papillomavirus (HPV): Secondary | ICD-10-CM | POA: Insufficient documentation

## 2013-05-18 DIAGNOSIS — Z01419 Encounter for gynecological examination (general) (routine) without abnormal findings: Secondary | ICD-10-CM | POA: Insufficient documentation

## 2013-12-02 ENCOUNTER — Other Ambulatory Visit: Payer: Self-pay

## 2013-12-02 DIAGNOSIS — Z1231 Encounter for screening mammogram for malignant neoplasm of breast: Secondary | ICD-10-CM

## 2013-12-10 ENCOUNTER — Inpatient Hospital Stay: Admission: RE | Admit: 2013-12-10 | Payer: BC Managed Care – PPO | Source: Ambulatory Visit

## 2014-10-14 ENCOUNTER — Ambulatory Visit: Payer: Self-pay

## 2015-02-06 ENCOUNTER — Other Ambulatory Visit: Payer: Self-pay

## 2015-02-06 DIAGNOSIS — Z1231 Encounter for screening mammogram for malignant neoplasm of breast: Secondary | ICD-10-CM

## 2015-02-24 ENCOUNTER — Ambulatory Visit: Payer: Self-pay

## 2015-02-27 ENCOUNTER — Ambulatory Visit
Admission: RE | Admit: 2015-02-27 | Discharge: 2015-02-27 | Disposition: A | Discharge status: Home / Self Care | Payer: 59 | Source: Ambulatory Visit

## 2015-02-27 DIAGNOSIS — Z1231 Encounter for screening mammogram for malignant neoplasm of breast: Secondary | ICD-10-CM

## 2015-07-18 ENCOUNTER — Ambulatory Visit: Payer: Self-pay | Admitting: Internal Medicine

## 2015-08-02 ENCOUNTER — Ambulatory Visit (INDEPENDENT_AMBULATORY_CARE_PROVIDER_SITE_OTHER): Payer: Medicare Other | Admitting: Internal Medicine

## 2015-08-02 ENCOUNTER — Other Ambulatory Visit (INDEPENDENT_AMBULATORY_CARE_PROVIDER_SITE_OTHER): Payer: Medicare Other

## 2015-08-02 ENCOUNTER — Encounter: Payer: Self-pay | Admitting: Internal Medicine

## 2015-08-02 VITALS — BP 128/82 | HR 56 | Temp 97.6°F | Resp 14 | Ht 67.0 in | Wt 177.0 lb

## 2015-08-02 DIAGNOSIS — R5383 Other fatigue: Secondary | ICD-10-CM

## 2015-08-02 DIAGNOSIS — Z13220 Encounter for screening for lipoid disorders: Secondary | ICD-10-CM | POA: Diagnosis not present

## 2015-08-02 DIAGNOSIS — Z0000 Encounter for general adult medical examination without abnormal findings: Secondary | ICD-10-CM

## 2015-08-02 DIAGNOSIS — M1990 Unspecified osteoarthritis, unspecified site: Secondary | ICD-10-CM

## 2015-08-02 DIAGNOSIS — Z1159 Encounter for screening for other viral diseases: Secondary | ICD-10-CM | POA: Diagnosis not present

## 2015-08-02 DIAGNOSIS — E789 Disorder of lipoprotein metabolism, unspecified: Secondary | ICD-10-CM | POA: Diagnosis not present

## 2015-08-02 LAB — COMPREHENSIVE METABOLIC PANEL
ALT: 14 U/L (ref 0–35)
AST: 20 U/L (ref 0–37)
Albumin: 4.7 g/dL (ref 3.5–5.2)
Alkaline Phosphatase: 58 U/L (ref 39–117)
BUN: 13 mg/dL (ref 6–23)
CO2: 31 mEq/L (ref 19–32)
Calcium: 10.4 mg/dL (ref 8.4–10.5)
Chloride: 104 mEq/L (ref 96–112)
Creatinine, Ser: 0.87 mg/dL (ref 0.40–1.20)
GFR: 84.03 mL/min (ref >60.00)
Glucose, Bld: 102 mg/dL — ABNORMAL HIGH (ref 70–99)
Potassium: 5.2 mEq/L — ABNORMAL HIGH (ref 3.5–5.1)
Sodium: 140 mEq/L (ref 135–145)
Total Bilirubin: 0.5 mg/dL (ref 0.2–1.2)
Total Protein: 7.8 g/dL (ref 6.0–8.3)

## 2015-08-02 LAB — CBC
HCT: 36.4 % (ref 36.0–46.0)
Hemoglobin: 12.2 g/dL (ref 12.0–15.0)
MCHC: 33.5 g/dL (ref 30.0–36.0)
MCV: 82.8 fl (ref 78.0–100.0)
Platelets: 310 10*3/uL (ref 150.0–400.0)
RBC: 4.4 Mil/uL (ref 3.87–5.11)
RDW: 14.7 % (ref 11.5–15.5)
WBC: 3.9 10*3/uL — ABNORMAL LOW (ref 4.0–10.5)

## 2015-08-02 LAB — LIPID PANEL
Cholesterol: 253 mg/dL — ABNORMAL HIGH (ref 0–200)
HDL: 117.4 mg/dL (ref >39.00)
LDL Cholesterol: 124 mg/dL — ABNORMAL HIGH (ref 0–99)
NonHDL: 135.94
Total CHOL/HDL Ratio: 2
Triglycerides: 61 mg/dL (ref 0.0–149.0)
VLDL: 12.2 mg/dL (ref 0.0–40.0)

## 2015-08-02 NOTE — Patient Instructions (Signed)
We will check the labs today and call you back about the results.  Keep up the good work with your health and we can check on your health every 1-2 years.   Health Maintenance, Female Adopting a healthy lifestyle and getting preventive care can go a long way to promote health and wellness. Talk with your health care provider about what schedule of regular examinations is right for you. This is a good chance for you to check in with your provider about disease prevention and staying healthy. In between checkups, there are plenty of things you can do on your own. Experts have done a lot of research about which lifestyle changes and preventive measures are most likely to keep you healthy. Ask your health care provider for more information. WEIGHT AND DIET  Eat a healthy diet  Be sure to include plenty of vegetables, fruits, low-fat dairy products, and lean protein.  Do not eat a lot of foods high in solid fats, added sugars, or salt.  Get regular exercise. This is one of the most important things you can do for your health.  Most adults should exercise for at least 150 minutes each week. The exercise should increase your heart rate and make you sweat (moderate-intensity exercise).  Most adults should also do strengthening exercises at least twice a week. This is in addition to the moderate-intensity exercise.  Maintain a healthy weight  Body mass index (BMI) is a measurement that can be used to identify possible weight problems. It estimates body fat based on height and weight. Your health care provider can help determine your BMI and help you achieve or maintain a healthy weight.  For females 18 years of age and older:   A BMI below 18.5 is considered underweight.  A BMI of 18.5 to 24.9 is normal.  A BMI of 25 to 29.9 is considered overweight.  A BMI of 30 and above is considered obese.  Watch levels of cholesterol and blood lipids  You should start having your blood tested for  lipids and cholesterol at 65 years of age, then have this test every 5 years.  You may need to have your cholesterol levels checked more often if:  Your lipid or cholesterol levels are high.  You are older than 65 years of age.  You are at high risk for heart disease.  CANCER SCREENING   Lung Cancer  Lung cancer screening is recommended for adults 85-21 years old who are at high risk for lung cancer because of a history of smoking.  A yearly low-dose CT scan of the lungs is recommended for people who:  Currently smoke.  Have quit within the past 15 years.  Have at least a 30-pack-year history of smoking. A pack year is smoking an average of one pack of cigarettes a day for 1 year.  Yearly screening should continue until it has been 15 years since you quit.  Yearly screening should stop if you develop a health problem that would prevent you from having lung cancer treatment.  Breast Cancer  Practice breast self-awareness. This means understanding how your breasts normally appear and feel.  It also means doing regular breast self-exams. Let your health care provider know about any changes, no matter how small.  If you are in your 20s or 30s, you should have a clinical breast exam (CBE) by a health care provider every 1-3 years as part of a regular health exam.  If you are 81 or older, have a  CBE every year. Also consider having a breast X-ray (mammogram) every year.  If you have a family history of breast cancer, talk to your health care provider about genetic screening.  If you are at high risk for breast cancer, talk to your health care provider about having an MRI and a mammogram every year.  Breast cancer gene (BRCA) assessment is recommended for women who have family members with BRCA-related cancers. BRCA-related cancers include:  Breast.  Ovarian.  Tubal.  Peritoneal cancers.  Results of the assessment will determine the need for genetic counseling and BRCA1  and BRCA2 testing. Cervical Cancer Your health care provider may recommend that you be screened regularly for cancer of the pelvic organs (ovaries, uterus, and vagina). This screening involves a pelvic examination, including checking for microscopic changes to the surface of your cervix (Pap test). You may be encouraged to have this screening done every 3 years, beginning at age 21.  For women ages 30-65, health care providers may recommend pelvic exams and Pap testing every 3 years, or they may recommend the Pap and pelvic exam, combined with testing for human papilloma virus (HPV), every 5 years. Some types of HPV increase your risk of cervical cancer. Testing for HPV may also be done on women of any age with unclear Pap test results.  Other health care providers may not recommend any screening for nonpregnant women who are considered low risk for pelvic cancer and who do not have symptoms. Ask your health care provider if a screening pelvic exam is right for you.  If you have had past treatment for cervical cancer or a condition that could lead to cancer, you need Pap tests and screening for cancer for at least 20 years after your treatment. If Pap tests have been discontinued, your risk factors (such as having a new sexual partner) need to be reassessed to determine if screening should resume. Some women have medical problems that increase the chance of getting cervical cancer. In these cases, your health care provider may recommend more frequent screening and Pap tests. Colorectal Cancer  This type of cancer can be detected and often prevented.  Routine colorectal cancer screening usually begins at 65 years of age and continues through 65 years of age.  Your health care provider may recommend screening at an earlier age if you have risk factors for colon cancer.  Your health care provider may also recommend using home test kits to check for hidden blood in the stool.  A small camera at the  end of a tube can be used to examine your colon directly (sigmoidoscopy or colonoscopy). This is done to check for the earliest forms of colorectal cancer.  Routine screening usually begins at age 50.  Direct examination of the colon should be repeated every 5-10 years through 65 years of age. However, you may need to be screened more often if early forms of precancerous polyps or small growths are found. Skin Cancer  Check your skin from head to toe regularly.  Tell your health care provider about any new moles or changes in moles, especially if there is a change in a mole's shape or color.  Also tell your health care provider if you have a mole that is larger than the size of a pencil eraser.  Always use sunscreen. Apply sunscreen liberally and repeatedly throughout the day.  Protect yourself by wearing long sleeves, pants, a wide-brimmed hat, and sunglasses whenever you are outside. HEART DISEASE, DIABETES, AND HIGH BLOOD   PRESSURE   High blood pressure causes heart disease and increases the risk of stroke. High blood pressure is more likely to develop in:  People who have blood pressure in the high end of the normal range (130-139/85-89 mm Hg).  People who are overweight or obese.  People who are African American.  If you are 18-39 years of age, have your blood pressure checked every 3-5 years. If you are 40 years of age or older, have your blood pressure checked every year. You should have your blood pressure measured twice--once when you are at a hospital or clinic, and once when you are not at a hospital or clinic. Record the average of the two measurements. To check your blood pressure when you are not at a hospital or clinic, you can use:  An automated blood pressure machine at a pharmacy.  A home blood pressure monitor.  If you are between 55 years and 79 years old, ask your health care provider if you should take aspirin to prevent strokes.  Have regular diabetes  screenings. This involves taking a blood sample to check your fasting blood sugar level.  If you are at a normal weight and have a low risk for diabetes, have this test once every three years after 65 years of age.  If you are overweight and have a high risk for diabetes, consider being tested at a younger age or more often. PREVENTING INFECTION  Hepatitis B  If you have a higher risk for hepatitis B, you should be screened for this virus. You are considered at high risk for hepatitis B if:  You were born in a country where hepatitis B is common. Ask your health care provider which countries are considered high risk.  Your parents were born in a high-risk country, and you have not been immunized against hepatitis B (hepatitis B vaccine).  You have HIV or AIDS.  You use needles to inject street drugs.  You live with someone who has hepatitis B.  You have had sex with someone who has hepatitis B.  You get hemodialysis treatment.  You take certain medicines for conditions, including cancer, organ transplantation, and autoimmune conditions. Hepatitis C  Blood testing is recommended for:  Everyone born from 1945 through 1965.  Anyone with known risk factors for hepatitis C. Sexually transmitted infections (STIs)  You should be screened for sexually transmitted infections (STIs) including gonorrhea and chlamydia if:  You are sexually active and are younger than 65 years of age.  You are older than 65 years of age and your health care provider tells you that you are at risk for this type of infection.  Your sexual activity has changed since you were last screened and you are at an increased risk for chlamydia or gonorrhea. Ask your health care provider if you are at risk.  If you do not have HIV, but are at risk, it may be recommended that you take a prescription medicine daily to prevent HIV infection. This is called pre-exposure prophylaxis (PrEP). You are considered at risk  if:  You are sexually active and do not regularly use condoms or know the HIV status of your partner(s).  You take drugs by injection.  You are sexually active with a partner who has HIV. Talk with your health care provider about whether you are at high risk of being infected with HIV. If you choose to begin PrEP, you should first be tested for HIV. You should then be tested every 3   months for as long as you are taking PrEP.  PREGNANCY   If you are premenopausal and you may become pregnant, ask your health care provider about preconception counseling.  If you may become pregnant, take 400 to 800 micrograms (mcg) of folic acid every day.  If you want to prevent pregnancy, talk to your health care provider about birth control (contraception). OSTEOPOROSIS AND MENOPAUSE   Osteoporosis is a disease in which the bones lose minerals and strength with aging. This can result in serious bone fractures. Your risk for osteoporosis can be identified using a bone density scan.  If you are 47 years of age or older, or if you are at risk for osteoporosis and fractures, ask your health care provider if you should be screened.  Ask your health care provider whether you should take a calcium or vitamin D supplement to lower your risk for osteoporosis.  Menopause may have certain physical symptoms and risks.  Hormone replacement therapy may reduce some of these symptoms and risks. Talk to your health care provider about whether hormone replacement therapy is right for you.  HOME CARE INSTRUCTIONS   Schedule regular health, dental, and eye exams.  Stay current with your immunizations.   Do not use any tobacco products including cigarettes, chewing tobacco, or electronic cigarettes.  If you are pregnant, do not drink alcohol.  If you are breastfeeding, limit how much and how often you drink alcohol.  Limit alcohol intake to no more than 1 drink per day for nonpregnant women. One drink equals 12  ounces of beer, 5 ounces of wine, or 1 ounces of hard liquor.  Do not use street drugs.  Do not share needles.  Ask your health care provider for help if you need support or information about quitting drugs.  Tell your health care provider if you often feel depressed.  Tell your health care provider if you have ever been abused or do not feel safe at home.   This information is not intended to replace advice given to you by your health care provider. Make sure you discuss any questions you have with your health care provider.   Document Released: 10/15/2010 Document Revised: 04/22/2014 Document Reviewed: 03/03/2013 Elsevier Interactive Patient Education [employer].

## 2015-08-02 NOTE — Progress Notes (Signed)
Pre visit review using our clinic review tool, if applicable. No additional management support is needed unless otherwise documented below in the visit note. 

## 2015-08-03 LAB — HEPATITIS C ANTIBODY: HCV Ab: NEGATIVE

## 2015-08-04 ENCOUNTER — Encounter: Payer: Self-pay | Admitting: Internal Medicine

## 2015-08-04 DIAGNOSIS — M1990 Unspecified osteoarthritis, unspecified site: Secondary | ICD-10-CM | POA: Insufficient documentation

## 2015-08-04 NOTE — Assessment & Plan Note (Signed)
She does not want to take any medications for the pain but talked to her about voltaren gel, she will pass for now. Talked to her about the need to stay busy and active to help with prevention of joint progression and stiffness. No indication for testing for RA as not typical symptoms with only 15 minutes stiffness in the morning.

## 2015-08-04 NOTE — Progress Notes (Signed)
   Subjective:    Patient ID: Sheri Kelly, female    DOB: February 25, 1951, 65 y.o.   MRN: [id number]  HPI The patient is a 65 YO female coming in new for arthritis. She does have joints in her hand that lock up occasionally. She does not like to take medication so does not often. She does try to stay busy and active.   PMH, [facility], social history reviewed and updated.   Review of Systems  Constitutional: Negative for fever, activity change, appetite change, fatigue and unexpected weight change.  HENT: Negative.   Eyes: Negative.   Respiratory: Negative for cough, chest tightness, shortness of breath and wheezing.   Cardiovascular: Negative for chest pain, palpitations and leg swelling.  Gastrointestinal: Negative for nausea, abdominal pain, diarrhea, constipation and abdominal distention.  Musculoskeletal: Positive for arthralgias. Negative for myalgias, joint swelling and gait problem.  Skin: Negative.   Neurological: Negative.   Psychiatric/Behavioral: Negative.       Objective:   Physical Exam  Constitutional: She is oriented to person, place, and time. She appears well-developed and well-nourished.  HENT:  Head: Normocephalic and atraumatic.  Eyes: EOM are normal.  Neck: Normal range of motion.  Cardiovascular: Normal rate and regular rhythm.   Pulmonary/Chest: Effort normal and breath sounds normal. No respiratory distress. She has no wheezes. She has no rales.  Abdominal: Soft. Bowel sounds are normal. She exhibits no distension. There is no tenderness. There is no rebound.  Musculoskeletal: She exhibits tenderness.  Tenderness in her mcp joint both hands.   Neurological: She is alert and oriented to person, place, and time. Coordination normal.  Skin: Skin is warm and dry.  Psychiatric: She has a normal mood and affect.   Filed Vitals:   08/02/15 1109  BP: 128/82  Pulse: 56  Temp: 97.6 F (36.4 C)  TempSrc: Oral  Resp: 14  Height: 5\' 7"  (1.702 m)  Weight: 177 lb (80.287  kg)  SpO2: 93%      Assessment & Plan:

## 2015-08-09 DIAGNOSIS — M25852 Other specified joint disorders, left hip: Secondary | ICD-10-CM | POA: Diagnosis not present

## 2015-08-09 DIAGNOSIS — H2513 Age-related nuclear cataract, bilateral: Secondary | ICD-10-CM | POA: Diagnosis not present

## 2015-08-21 DIAGNOSIS — M25859 Other specified joint disorders, unspecified hip: Secondary | ICD-10-CM | POA: Diagnosis not present

## 2015-09-05 DIAGNOSIS — M25852 Other specified joint disorders, left hip: Secondary | ICD-10-CM | POA: Diagnosis not present

## 2015-09-13 DIAGNOSIS — M25852 Other specified joint disorders, left hip: Secondary | ICD-10-CM | POA: Diagnosis not present

## 2015-09-21 DIAGNOSIS — M25859 Other specified joint disorders, unspecified hip: Secondary | ICD-10-CM | POA: Diagnosis not present

## 2015-09-24 LAB — GLUCOSE, POCT (MANUAL RESULT ENTRY): POC Glucose: 100 mg/dl — AB (ref 70–99)

## 2015-09-28 DIAGNOSIS — M25859 Other specified joint disorders, unspecified hip: Secondary | ICD-10-CM | POA: Diagnosis not present

## 2015-11-27 DIAGNOSIS — M7062 Trochanteric bursitis, left hip: Secondary | ICD-10-CM | POA: Diagnosis not present

## 2015-11-27 DIAGNOSIS — M9906 Segmental and somatic dysfunction of lower extremity: Secondary | ICD-10-CM | POA: Diagnosis not present

## 2015-11-27 DIAGNOSIS — M9913 Subluxation complex (vertebral) of lumbar region: Secondary | ICD-10-CM | POA: Diagnosis not present

## 2015-11-27 DIAGNOSIS — M5388 Other specified dorsopathies, sacral and sacrococcygeal region: Secondary | ICD-10-CM | POA: Diagnosis not present

## 2015-11-27 DIAGNOSIS — M9905 Segmental and somatic dysfunction of pelvic region: Secondary | ICD-10-CM | POA: Diagnosis not present

## 2015-11-27 DIAGNOSIS — M9903 Segmental and somatic dysfunction of lumbar region: Secondary | ICD-10-CM | POA: Diagnosis not present

## 2016-02-20 ENCOUNTER — Telehealth: Payer: Self-pay | Admitting: Internal Medicine

## 2016-02-20 DIAGNOSIS — M1990 Unspecified osteoarthritis, unspecified site: Secondary | ICD-10-CM

## 2016-02-20 NOTE — Telephone Encounter (Signed)
Referral placed.

## 2016-02-20 NOTE — Telephone Encounter (Signed)
Pt request referral to Dr. Hermenia Fiscal for arthritis (fax # [phone]). Please advise

## 2016-05-08 ENCOUNTER — Other Ambulatory Visit: Payer: Self-pay | Admitting: Family Medicine

## 2016-05-08 DIAGNOSIS — Z1231 Encounter for screening mammogram for malignant neoplasm of breast: Secondary | ICD-10-CM

## 2016-05-29 DIAGNOSIS — E663 Overweight: Secondary | ICD-10-CM | POA: Diagnosis not present

## 2016-05-29 DIAGNOSIS — M25552 Pain in left hip: Secondary | ICD-10-CM | POA: Diagnosis not present

## 2016-05-29 DIAGNOSIS — M150 Primary generalized (osteo)arthritis: Secondary | ICD-10-CM | POA: Diagnosis not present

## 2016-05-29 DIAGNOSIS — Z6828 Body mass index (BMI) 28.0-28.9, adult: Secondary | ICD-10-CM | POA: Diagnosis not present

## 2016-06-04 ENCOUNTER — Ambulatory Visit: Payer: Medicare Other

## 2016-08-29 DIAGNOSIS — Z6827 Body mass index (BMI) 27.0-27.9, adult: Secondary | ICD-10-CM | POA: Diagnosis not present

## 2016-08-29 DIAGNOSIS — E663 Overweight: Secondary | ICD-10-CM | POA: Diagnosis not present

## 2016-08-29 DIAGNOSIS — M150 Primary generalized (osteo)arthritis: Secondary | ICD-10-CM | POA: Diagnosis not present

## 2016-08-29 DIAGNOSIS — M25552 Pain in left hip: Secondary | ICD-10-CM | POA: Diagnosis not present

## 2016-08-29 DIAGNOSIS — M2550 Pain in unspecified joint: Secondary | ICD-10-CM | POA: Diagnosis not present

## 2016-09-03 ENCOUNTER — Other Ambulatory Visit: Payer: Self-pay | Admitting: Family Medicine

## 2016-09-03 ENCOUNTER — Ambulatory Visit
Admission: RE | Admit: 2016-09-03 | Discharge: 2016-09-03 | Disposition: A | Discharge status: Home / Self Care | Payer: Medicare Other | Source: Ambulatory Visit | Attending: Family Medicine | Admitting: Family Medicine

## 2016-09-03 DIAGNOSIS — Z1231 Encounter for screening mammogram for malignant neoplasm of breast: Secondary | ICD-10-CM | POA: Diagnosis not present

## 2016-09-03 HISTORY — DX: Breast implant status: Z98.82

## 2016-09-03 HISTORY — DX: Other specified postprocedural states: Z98.890

## 2016-09-25 ENCOUNTER — Encounter: Payer: Self-pay | Admitting: Family Medicine

## 2016-09-25 ENCOUNTER — Ambulatory Visit (INDEPENDENT_AMBULATORY_CARE_PROVIDER_SITE_OTHER): Payer: Medicare Other | Admitting: Family Medicine

## 2016-09-25 VITALS — BP 108/71 | HR 78 | Temp 98.0°F | Resp 20 | Ht 67.0 in | Wt 179.5 lb

## 2016-09-25 DIAGNOSIS — Z7189 Other specified counseling: Secondary | ICD-10-CM | POA: Diagnosis not present

## 2016-09-25 DIAGNOSIS — Z23 Encounter for immunization: Secondary | ICD-10-CM

## 2016-09-25 DIAGNOSIS — R7309 Other abnormal glucose: Secondary | ICD-10-CM | POA: Diagnosis not present

## 2016-09-25 DIAGNOSIS — Z7689 Persons encountering health services in other specified circumstances: Secondary | ICD-10-CM | POA: Diagnosis not present

## 2016-09-25 DIAGNOSIS — Z0000 Encounter for general adult medical examination without abnormal findings: Secondary | ICD-10-CM | POA: Diagnosis not present

## 2016-09-25 DIAGNOSIS — Z130 Encounter for screening for diseases of the blood and blood-forming organs and certain disorders involving the immune mechanism: Secondary | ICD-10-CM | POA: Diagnosis not present

## 2016-09-25 DIAGNOSIS — E2839 Other primary ovarian failure: Secondary | ICD-10-CM

## 2016-09-25 DIAGNOSIS — Z6828 Body mass index (BMI) 28.0-28.9, adult: Secondary | ICD-10-CM | POA: Diagnosis not present

## 2016-09-25 DIAGNOSIS — Z131 Encounter for screening for diabetes mellitus: Secondary | ICD-10-CM | POA: Diagnosis not present

## 2016-09-25 LAB — CBC WITH DIFFERENTIAL/PLATELET
Basophils Absolute: 0 10*3/uL (ref 0.0–0.1)
Basophils Relative: 0.7 % (ref 0.0–3.0)
Eosinophils Absolute: 0 10*3/uL (ref 0.0–0.7)
Eosinophils Relative: 0.8 % (ref 0.0–5.0)
HCT: 36.7 % (ref 36.0–46.0)
Hemoglobin: 12.1 g/dL (ref 12.0–15.0)
Lymphocytes Relative: 28.8 % (ref 12.0–46.0)
Lymphs Abs: 1 10*3/uL (ref 0.7–4.0)
MCHC: 32.8 g/dL (ref 30.0–36.0)
MCV: 85.1 fl (ref 78.0–100.0)
Monocytes Absolute: 0.3 10*3/uL (ref 0.1–1.0)
Monocytes Relative: 7.7 % (ref 3.0–12.0)
Neutro Abs: 2.2 10*3/uL (ref 1.4–7.7)
Neutrophils Relative %: 62 % (ref 43.0–77.0)
Platelets: 280 10*3/uL (ref 150.0–400.0)
RBC: 4.32 Mil/uL (ref 3.87–5.11)
RDW: 14 % (ref 11.5–15.5)
WBC: 3.5 10*3/uL — ABNORMAL LOW (ref 4.0–10.5)

## 2016-09-25 LAB — COMPREHENSIVE METABOLIC PANEL
ALT: 13 U/L (ref 0–35)
AST: 18 U/L (ref 0–37)
Albumin: 4.4 g/dL (ref 3.5–5.2)
Alkaline Phosphatase: 57 U/L (ref 39–117)
BUN: 21 mg/dL (ref 6–23)
CO2: 31 mEq/L (ref 19–32)
Calcium: 10.2 mg/dL (ref 8.4–10.5)
Chloride: 107 mEq/L (ref 96–112)
Creatinine, Ser: 0.86 mg/dL (ref 0.40–1.20)
GFR: 84.86 mL/min (ref >60.00)
Glucose, Bld: 68 mg/dL — ABNORMAL LOW (ref 70–99)
Potassium: 5 mEq/L (ref 3.5–5.1)
Sodium: 142 mEq/L (ref 135–145)
Total Bilirubin: 0.5 mg/dL (ref 0.2–1.2)
Total Protein: 6.9 g/dL (ref 6.0–8.3)

## 2016-09-25 MED ORDER — TETANUS-DIPHTH-ACELL PERTUSSIS 5-2.5-18.5 LF-MCG/0.5 IM SUSP: 0.5000 mL | Freq: Once | INTRAMUSCULAR | 0 refills | Status: AC

## 2016-09-25 NOTE — Progress Notes (Signed)
Medicare AWV, new establishing patient Chief Complaint  Patient presents with  . Medicare Wellness    Patient Care Team    Relationship Specialty Notifications Start End  Ma Hillock, DO PCP - General Family Medicine  09/25/16   Inda Castle, MD Consulting Physician Gastroenterology  09/25/16   Everett Graff, MD Consulting Physician Obstetrics and Gynecology  09/25/16   Laurence Aly, OD  Optometry  09/25/16    Comment: [name]     History of Present Ilness: Sheri Kelly, 66 y.o. , female presents today for establishment of care and  Medicare wellness visit.   Past medical, surgical, family and social histories reviewed (including experiences with illnesses, hospital stays, operations, injuries, and treatments): all reviewed an updated today in the chart.  Past Medical History:  Diagnosis Date  . Arthritis   . Cervical dysplasia   . Cervical polyp 01/14/2008  . History of breast reconstruction    Bilateral   . History of chicken pox   . History of measles, mumps, or rubella   . Infertility, female   . Menopausal symptoms   . Uterine fibroid    All allergies reviewed Allergies  Allergen Reactions  . Dust Mite Extract   . Molds & Smuts    Past Surgical History:  Procedure Laterality Date  . ABDOMINAL HYSTERECTOMY  09/19/2009-09/22/2009   total  . BREAST SURGERY  1998   "breast lift"  . CESAREAN SECTION      x 2  . INTRAOCULAR LENS INSERTION Bilateral   . MOUTH SURGERY    . SALPINGOOPHORECTOMY  09/19/2009   right  . TUBAL LIGATION     bilateral  . UTERINE FIBROID SURGERY     Family History  Problem Relation Age of Onset  . Arthritis Mother   . Breast cancer Mother   . Lung cancer Father   . Hypertension Brother   . Throat cancer Brother   . Leukemia Sister    Social History   Social History Narrative   Divorced. 1 child ([name]).   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    Drinks caffeine, uses herbal remedies. Takes a daily vitamin.     Wears seatbelt, bicycle helmet and dentures.    Smoke detector in the home. Firearms locked in the home.    Feels safe in her relationships.     All medications verified Allergies as of 09/25/2016      Reactions   Dust Mite Extract    Molds & Smuts       Medication List       Accurate as of 09/25/16 11:25 AM. Always use your most recent med list.          cholecalciferol 1000 units tablet Commonly known as:  VITAMIN D Take 1,000 Units by mouth daily.   CURCUMIN 95 PO Take by mouth.   glucosamine-chondroitin 500-400 MG tablet Take 1 tablet by mouth 3 (three) times daily.   MAG-200 PO Take by mouth.   multivitamin tablet Take 1 tablet by mouth daily.   OMEGA 3-6-9 COMPLEX PO Take 1 capsule by mouth daily.   Tdap 5-2.5-18.5 LF-MCG/0.5 injection Commonly known as:  BOOSTRIX Inject 0.5 mLs into the muscle once.   vitamin B-12 1000 MCG tablet Commonly known as:  CYANOCOBALAMIN Take 1,000 mcg by mouth daily.       Health maintenance:  Colonoscopy: no fhx, Dr. Deatra Ina, 09/18/2010, no polyps, diverticulosis present. Follow up in 10 years.  Mammogram (  50-74): Fhx present in mother, mammogram 09/03/2016, normal.  Cervical cancer screening(<65): Pt has a hystrectomy and follows with GYN.  Immunizations: tdap script provided today.  Prevnar completed today, due for pneumovax in 2019. Flu shot encouraged yearly.  Infectious disease screening: Hep C completed.  Glaucoma screen: has completed yearly, [facility], results normal Hearing: Whisper test, no barriers identified. No barriers.   Depression screen [facility] 2/9 09/25/2016 08/02/2015  Decreased Interest 0 0  Down, Depressed, Hopeless 0 0  PHQ - 2 Score 0 0   No flowsheet data found.  Cognitive assessment: no barriers Word recall (daisy, blue, church): 3/3 Clock draw: face/numbers drawn: normal  Immunizations: Immunization History  Administered Date(s) Administered  . Pneumococcal  Conjugate-13 09/25/2016    Exercise: Current Exercise Habits: Structured exercise class, Type of exercise: strength training/weights, Time (Minutes): 60, Frequency (Times/Week): 1, Weekly Exercise (Minutes/Week): 60, Intensity: Mild    Diet: Regular  Functional Status Survey: Get up and go test: steady and less than 20 seconds. No barriers.    Current Exercise Habits: Structured exercise class, Type of exercise: strength training/weights, Time (Minutes): 60, Frequency (Times/Week): 1, Weekly Exercise (Minutes/Week): 60, Intensity: Mild   Fall Risk  09/25/2016 08/02/2015  Falls in the past year? No No    Advanced Care Planning: N, advanced directives packet provided to pt   Cardiovascular Screening Blood Tests Lipid Panel     Component Value Date/Time   CHOL 253 (H) 08/02/2015 1157   TRIG 61.0 08/02/2015 1157   HDL 117.40 08/02/2015 1157   CHOLHDL 2 08/02/2015 1157   VLDL 12.2 08/02/2015 1157   LDLCALC 124 (H) 08/02/2015 1157    Diabetes Screening Tests BMP Latest Ref Rng & Units 08/02/2015 10/02/2009 10/01/2009  Glucose 70 - 99 mg/dL 102(H) 117(H) 114(H)  BUN 6 - 23 mg/dL 13 5(L) 6  Creatinine 0.40 - 1.20 mg/dL 0.87 0.77 0.75  Sodium 135 - 145 mEq/L 140 138 137  Potassium 3.5 - 5.1 mEq/L 5.2(H) 4.6 4.5  Chloride 96 - 112 mEq/L 104 101 98  CO2 19 - 32 mEq/L 31 30 30   Calcium 8.4 - 10.5 mg/dL 10.4 9.5 9.7     Diabetes Self-Mgmt Training and Medical Nutrition Therapy  n/a  AAA screen: completed if female 36- 40 and if ever smoked and patient agreed to screening.  EKG screen: Completed only if felt indicated during initial medicare visit and patient agreed to screening.   Alcohol abuse screening: completed if indicated STIs screening: Completed if indicated  BP 108/71 (BP Location: Right Arm, Patient Position: Sitting, Cuff Size: Normal)   Pulse 78   Temp 98 F (36.7 C)   Resp 20   Ht 5' 7"  (1.702 m)   Wt 179 lb 8 oz (81.4 kg)   SpO2 98%   BMI 28.11 kg/m  Gen:  Afebrile. No acute distress. Very pleasant AAF. Mildly overweight.  HENT: AT. [place]. Bilateral TM visualized and normal in appearance. MMM. Bilateral nares with erythema or swelling. Throat without erythema or exudates. No cough Eyes:Pupils Equal Round Reactive to light, Extraocular movements intact,  Conjunctiva without redness, discharge or icterus. Neck/lymp/endocrine: Supple,no lymphadenopathy, no thyromegaly CV: RRR no murmur, no edema, +2/4 P posterior tibialis pulses Chest: CTAB, no wheeze or crackles Abd: Soft. round. NTND. BS present. No  Masses palpated.  MSK: no obvious deformities, FROM.  Skin: no rashes, purpura or petechiae.  Neuro:  Normal gait. PERLA. EOMi. Alert. Oriented x3  Psych: Normal affect, dress and  demeanor. Normal speech. Normal thought content and judgment.  Assessment and Plan: Jenica Costilow is a 66 y.o. AAF present to transfer care and medicare wellness.  Encounter to establish care - transfer within [place], new provider. Medicare annual wellness visit, subsequent - All screenings and immunizations pt entitled were discussed today and ordered if appropriate and pt agreeable.  - AVS on medicare wellness, and due dates of future screens provided to pt.   - CBC w/Diff - Comp Met (CMET) - Hemoglobin A1c BMI 28.0-28.9,adult - diet and exercise counseling discussed.  - CBC w/Diff - Comp Met (CMET) - Hemoglobin A1c Screening for deficiency anemia - CBC w/Diff - Comp Met (CMET) Diabetes mellitus screening Elevated glucose on past labs (mild) - Hemoglobin A1c Immunization due - tdap script provided to pt.  - Pneumococcal conjugate vaccine 13-valent Estrogen deficiency - DG Bone Density; Future--> breast center Advanced directives: Pt provided with advanced directive packet and encouraged to complete and return a copy to the office.    Follow yearly with MW.   Electronically Signed by: Howard Pouch, DO [place]

## 2016-09-25 NOTE — Patient Instructions (Addendum)
[name] , Thank you for taking time to come for your Medicare Wellness Visit. I appreciate your ongoing commitment to your health goals. Please review the following plan we discussed and let me know if I can assist you in the future.  Tetanus vaccine printed, take to pharmacy to have completed, make sure we know the date of completion.  Please complete advanced directives and bring [place] a copy.  You received the 1st pneumonia vaccine today, next one in a year to complete the series.  Bone density screening ordered, they will call you to schedule  These are the goals we discussed: Goals    None      This is a list of the screening recommended for you and due dates:  Health Maintenance  Topic Date Due  . Tetanus Vaccine  07/24/1969  . DEXA scan (bone density measurement)  07/25/2015  . Pneumonia vaccines (1 of 2 - PCV13) 07/25/2015  . Flu Shot  11/13/2016  . Mammogram  09/04/2018  . Colon Cancer Screening  09/17/2020  .  Hepatitis C: One time screening is recommended by Center for Disease Control  (CDC) for  adults born from [birth year] through 1965.   Completed     Please help [place] help you:  We are honored you have chosen [facility] for your Primary Care home. Below you will find basic instructions that you may need to access in the future. Please help [place] help you by reading the instructions, which cover many of the frequent questions we experience.   Prescription refills and request:  -In order to allow more efficient response time, please call your pharmacy for all refills. They will forward the request electronically to [place]. This allows for the quickest possible response. Request left on a nurse line can take longer to refill, since these are checked as time allows between office patients and other phone calls.  - refill request can take up to 3-5 working days to complete.  - If request is sent electronically and request is appropiate, it is usually completed in 1-2 business days.   - all patients will need to be seen routinely for all chronic medical conditions requiring prescription medications (see follow-up below). If you are overdue for follow up on your condition, you will be asked to make an appointment and we will call in enough medication to cover you until your appointment (up to 30 days).  - all controlled substances will require a face to face visit to request/refill.  - if you desire your prescriptions to go through a new pharmacy, and have an active script at original pharmacy, you will need to call your pharmacy and have scripts transferred to new pharmacy. This is completed between the pharmacy locations and not by your provider.    Results: If any images or labs were ordered, it can take up to 1 week to get results depending on the test ordered and the lab/facility running and resulting the test. - Normal or stable results, which do not need further discussion, may be released to your mychart immediately with attached note to you. A call may not be generated for normal results. Please make certain to sign up for mychart. If you have questions on how to activate your mychart you can call the front office.  - If your results need further discussion, our office will attempt to contact you via phone, and if unable to reach you after 2 attempts, we will release your abnormal result to your  mychart with instructions.  - All results will be automatically released in mychart after 1 week.  - Your provider will provide you with explanation and instruction on all relevant material in your results. Please keep in mind, results and labs may appear confusing or abnormal to the untrained eye, but it does not mean they are actually abnormal for you personally. If you have any questions about your results that are not covered, or you desire more detailed explanation than what was provided, you should make an appointment with your provider to do so.   Our office handles many  outgoing and incoming calls daily. If we have not contacted you within 1 week about your results, please check your mychart to see if there is a message first and if not, then contact our office.  In helping with this matter, you help decrease call volume, and therefore allow [place] to be able to respond to patients needs more efficiently.   Acute office visits (sick visit):  An acute visit is intended for a new problem and are scheduled in shorter time slots to allow schedule openings for patients with new problems. This is the appropriate visit to discuss a new problem. In order to provide you with excellent quality medical care with proper time for you to explain your problem, have an exam and receive treatment with instructions, these appointments should be limited to one new problem per visit. If you experience a new problem, in which you desire to be addressed, please make an acute office visit, we save openings on the schedule to accommodate you. Please do not save your new problem for any other type of visit, let us take care of it properly and quickly for you.   Follow up visits:  Depending on your condition(s) your provider will need to see you routinely in order to provide you with quality care and prescribe medication(s). Most chronic conditions (Example: hypertension, Diabetes, depression/anxiety... etc), require visits a couple times a year. Your provider will instruct you on proper follow up for your personal medical conditions and history. Please make certain to make follow up appointments for your condition as instructed. Failing to do so could result in lapse in your medication treatment/refills. If you request a refill, and are overdue to be seen on a condition, we will always provide you with a 30 day script (once) to allow you time to schedule.    Medicare wellness (well visit): - we have a wonderful Nurse Maudie Mercury), that will meet with you and provide you will yearly medicare wellness visits.  These visits should occur yearly (can not be scheduled less than 1 calendar year apart) and cover preventive health, immunizations, advance directives and screenings you are entitled to yearly through your medicare benefits. Do not miss out on your entitled benefits, this is when medicare will pay for these benefits to be ordered for you.  These are strongly encouraged by your provider and is the appropriate type of visit to make certain you are up to date with all preventive health benefits. If you have not had your medicare wellness exam in the last 12 months, please make certain to schedule one by calling the office and schedule your medicare wellness with Maudie Mercury as soon as possible.   Yearly physical (well visit):  - Adults are recommended to be seen yearly for physicals. Check with your insurance and date of your last physical, most insurances require one calendar year between physicals. Physicals include all preventive health topics, screenings,  medical exam and labs that are appropriate for gender/age and history. You may have fasting labs needed at this visit. This is a well visit (not a sick visit), new problems should not be covered during this visit (see acute visit).  - Pediatric patients are seen more frequently when they are younger. Your provider will advise you on well child visit timing that is appropriate for your their age. - This is not a medicare wellness visit. Medicare wellness exams do not have an exam portion to the visit. Some medicare companies allow for a physical, some do not allow a yearly physical. If your medicare allows a yearly physical you can schedule the medicare wellness with our nurse Maudie Mercury and have your physical with your provider after, on the same day. Please check with insurance for your full benefits.   Late Policy/No Shows:  - all new patients should arrive 15-30 minutes earlier than appointment to allow [place] time  to  obtain all personal demographics,  insurance  information and for you to complete office paperwork. - All established patients should arrive 10-15 minutes earlier than appointment time to update all information and be checked in .  - In our best efforts to run on time, if you are late for your appointment you will be asked to either reschedule or if able, we will work you back into the schedule. There will be a wait time to work you back in the schedule,  depending on availability.  - If you are unable to make it to your appointment as scheduled, please call 24 hours ahead of time to allow [place] to fill the time slot with someone else who needs to be seen. If you do not cancel your appointment ahead of time, you may be charged a no show fee.

## 2016-09-26 ENCOUNTER — Telehealth: Payer: Self-pay | Admitting: Family Medicine

## 2016-09-26 LAB — HEMOGLOBIN A1C
Hgb A1c MFr Bld: 5.5 % (ref <5.7)
Mean Plasma Glucose: 111 mg/dL

## 2016-09-26 NOTE — Telephone Encounter (Signed)
Please call pt: - her labs are all stable.

## 2016-09-26 NOTE — Telephone Encounter (Signed)
Mail box is full and can not receive messages. Will try to call back at later time.

## 2016-09-26 NOTE — Telephone Encounter (Signed)
Patient notified and verbalized understanding. 

## 2016-10-12 DIAGNOSIS — H2513 Age-related nuclear cataract, bilateral: Secondary | ICD-10-CM | POA: Diagnosis not present

## 2016-10-17 DIAGNOSIS — M65331 Trigger finger, right middle finger: Secondary | ICD-10-CM | POA: Diagnosis not present

## 2016-11-07 ENCOUNTER — Ambulatory Visit
Admission: RE | Admit: 2016-11-07 | Discharge: 2016-11-07 | Disposition: A | Discharge status: Home / Self Care | Payer: Medicare Other | Source: Ambulatory Visit | Attending: Family Medicine | Admitting: Family Medicine

## 2016-11-07 DIAGNOSIS — Z780 Asymptomatic menopausal state: Secondary | ICD-10-CM | POA: Diagnosis not present

## 2016-11-07 DIAGNOSIS — E2839 Other primary ovarian failure: Secondary | ICD-10-CM

## 2016-11-07 DIAGNOSIS — Z13820 Encounter for screening for osteoporosis: Secondary | ICD-10-CM | POA: Diagnosis not present

## 2016-11-08 ENCOUNTER — Encounter: Payer: Self-pay | Admitting: *Deleted

## 2016-11-08 ENCOUNTER — Telehealth: Payer: Self-pay | Admitting: Family Medicine

## 2016-11-08 NOTE — Telephone Encounter (Signed)
Sent information in My Chart tried to call patient but voice mail is full.

## 2016-11-08 NOTE — Telephone Encounter (Signed)
Please call pt - her bone density scan is normal.  - Continue vit D supplement.

## 2016-11-08 NOTE — Telephone Encounter (Signed)
Patient called inquiring about test results. Patient notified and verbalized understanding.

## 2016-11-18 DIAGNOSIS — M1612 Unilateral primary osteoarthritis, left hip: Secondary | ICD-10-CM | POA: Diagnosis not present

## 2016-12-19 DIAGNOSIS — M65331 Trigger finger, right middle finger: Secondary | ICD-10-CM | POA: Diagnosis not present

## 2017-01-30 DIAGNOSIS — Z806 Family history of leukemia: Secondary | ICD-10-CM | POA: Diagnosis not present

## 2017-01-30 DIAGNOSIS — R070 Pain in throat: Secondary | ICD-10-CM | POA: Diagnosis not present

## 2017-01-30 DIAGNOSIS — R5383 Other fatigue: Secondary | ICD-10-CM | POA: Diagnosis not present

## 2017-01-30 DIAGNOSIS — Z801 Family history of malignant neoplasm of trachea, bronchus and lung: Secondary | ICD-10-CM | POA: Diagnosis not present

## 2017-01-30 DIAGNOSIS — Z803 Family history of malignant neoplasm of breast: Secondary | ICD-10-CM | POA: Diagnosis not present

## 2017-01-30 DIAGNOSIS — R112 Nausea with vomiting, unspecified: Secondary | ICD-10-CM | POA: Diagnosis not present

## 2017-01-30 DIAGNOSIS — R05 Cough: Secondary | ICD-10-CM | POA: Diagnosis not present

## 2017-01-30 DIAGNOSIS — Z800 Family history of malignant neoplasm of digestive organs: Secondary | ICD-10-CM | POA: Diagnosis not present

## 2017-02-27 DIAGNOSIS — Z6826 Body mass index (BMI) 26.0-26.9, adult: Secondary | ICD-10-CM | POA: Diagnosis not present

## 2017-02-27 DIAGNOSIS — M2550 Pain in unspecified joint: Secondary | ICD-10-CM | POA: Diagnosis not present

## 2017-02-27 DIAGNOSIS — E663 Overweight: Secondary | ICD-10-CM | POA: Diagnosis not present

## 2017-02-27 DIAGNOSIS — M150 Primary generalized (osteo)arthritis: Secondary | ICD-10-CM | POA: Diagnosis not present

## 2017-02-27 DIAGNOSIS — M25552 Pain in left hip: Secondary | ICD-10-CM | POA: Diagnosis not present

## 2017-04-28 DIAGNOSIS — M65331 Trigger finger, right middle finger: Secondary | ICD-10-CM | POA: Diagnosis not present

## 2017-05-06 ENCOUNTER — Other Ambulatory Visit: Payer: Self-pay

## 2017-05-06 ENCOUNTER — Encounter (HOSPITAL_BASED_OUTPATIENT_CLINIC_OR_DEPARTMENT_OTHER): Payer: Self-pay | Admitting: *Deleted

## 2017-05-09 NOTE — H&P (Addendum)
Sheri Kelly is an 67 y.o. female.   Chief Complaint:right milldle finger gets stuck HPI: right middle finger triggering with increased frequency  Past Medical History:  Diagnosis Date  . Arthritis   . Cervical dysplasia   . Cervical polyp 01/14/2008  . Complication of anesthesia    had ileus after hysterectomy 2011  . History of breast reconstruction    Bilateral   . History of chicken pox   . History of measles, mumps, or rubella   . Infertility, female   . Menopausal symptoms   . Trigger finger of all digits of right hand    RMF  . Uterine fibroid     Past Surgical History:  Procedure Laterality Date  . ABDOMINAL HYSTERECTOMY  09/19/2009-09/22/2009   total  . BREAST SURGERY  1998   "breast lift"  . CESAREAN SECTION      x 2  . INTRAOCULAR LENS INSERTION Bilateral   . MOUTH SURGERY    . SALPINGOOPHORECTOMY  09/19/2009   right  . TUBAL LIGATION     bilateral  . UTERINE FIBROID SURGERY      Family History  Problem Relation Age of Onset  . Arthritis Mother   . Breast cancer Mother   . Lung cancer Father   . Hypertension Brother   . Throat cancer Brother   . Leukemia Sister    Social History:  reports that she quit smoking about 7 years ago. she has never used smokeless tobacco. She reports that she drinks about 3.0 oz of alcohol per week. She reports that she does not use drugs.  Allergies:  Allergies  Allergen Reactions  . Dust Mite Extract   . Molds & Smuts     No medications prior to admission.    No results found for this or any previous visit (from the past 48 hour(s)). No results found.  Review of Systems  Constitutional: Negative.   HENT: Negative.   Eyes: Negative.   Respiratory: Negative.   Cardiovascular: Negative.   Gastrointestinal: Negative.   Genitourinary: Negative.   Musculoskeletal: Negative.   Skin: Negative.   Neurological: Negative.   Endo/Heme/Allergies: Negative.   Psychiatric/Behavioral: Negative.     Height 5\' 7"  (1.702  m), weight 79.4 kg (175 lb). Physical Exam   Assessment/Plan Right middle trigger finger for release  TRUESDALE,GERALD L, MD 05/09/2017, 3:31 PM

## 2017-05-12 ENCOUNTER — Encounter (HOSPITAL_BASED_OUTPATIENT_CLINIC_OR_DEPARTMENT_OTHER): Payer: Self-pay | Admitting: Anesthesiology

## 2017-05-12 ENCOUNTER — Other Ambulatory Visit: Payer: Self-pay

## 2017-05-12 ENCOUNTER — Ambulatory Visit (HOSPITAL_BASED_OUTPATIENT_CLINIC_OR_DEPARTMENT_OTHER): Payer: Medicare Other | Admitting: Anesthesiology

## 2017-05-12 ENCOUNTER — Ambulatory Visit (HOSPITAL_BASED_OUTPATIENT_CLINIC_OR_DEPARTMENT_OTHER)
Admission: RE | Admit: 2017-05-12 | Discharge: 2017-05-12 | Disposition: A | Discharge status: Home / Self Care | Payer: Medicare Other | Source: Ambulatory Visit | Attending: Specialist | Admitting: Specialist

## 2017-05-12 ENCOUNTER — Encounter (HOSPITAL_BASED_OUTPATIENT_CLINIC_OR_DEPARTMENT_OTHER)
Admission: RE | Disposition: A | Discharge status: Home / Self Care | Payer: Self-pay | Source: Ambulatory Visit | Attending: Specialist

## 2017-05-12 DIAGNOSIS — Z90721 Acquired absence of ovaries, unilateral: Secondary | ICD-10-CM | POA: Insufficient documentation

## 2017-05-12 DIAGNOSIS — Z8261 Family history of arthritis: Secondary | ICD-10-CM | POA: Insufficient documentation

## 2017-05-12 DIAGNOSIS — Z8249 Family history of ischemic heart disease and other diseases of the circulatory system: Secondary | ICD-10-CM | POA: Insufficient documentation

## 2017-05-12 DIAGNOSIS — Z9079 Acquired absence of other genital organ(s): Secondary | ICD-10-CM | POA: Diagnosis not present

## 2017-05-12 DIAGNOSIS — Z806 Family history of leukemia: Secondary | ICD-10-CM | POA: Diagnosis not present

## 2017-05-12 DIAGNOSIS — M65331 Trigger finger, right middle finger: Secondary | ICD-10-CM | POA: Diagnosis not present

## 2017-05-12 DIAGNOSIS — M1990 Unspecified osteoarthritis, unspecified site: Secondary | ICD-10-CM | POA: Insufficient documentation

## 2017-05-12 DIAGNOSIS — Z9889 Other specified postprocedural states: Secondary | ICD-10-CM | POA: Diagnosis not present

## 2017-05-12 DIAGNOSIS — Z800 Family history of malignant neoplasm of digestive organs: Secondary | ICD-10-CM | POA: Insufficient documentation

## 2017-05-12 DIAGNOSIS — Z91048 Other nonmedicinal substance allergy status: Secondary | ICD-10-CM | POA: Diagnosis not present

## 2017-05-12 DIAGNOSIS — Z801 Family history of malignant neoplasm of trachea, bronchus and lung: Secondary | ICD-10-CM | POA: Insufficient documentation

## 2017-05-12 DIAGNOSIS — Z7951 Long term (current) use of inhaled steroids: Secondary | ICD-10-CM | POA: Diagnosis not present

## 2017-05-12 DIAGNOSIS — Z90710 Acquired absence of both cervix and uterus: Secondary | ICD-10-CM | POA: Diagnosis not present

## 2017-05-12 DIAGNOSIS — Z87891 Personal history of nicotine dependence: Secondary | ICD-10-CM | POA: Diagnosis not present

## 2017-05-12 DIAGNOSIS — Z803 Family history of malignant neoplasm of breast: Secondary | ICD-10-CM | POA: Diagnosis not present

## 2017-05-12 DIAGNOSIS — Z79899 Other long term (current) drug therapy: Secondary | ICD-10-CM | POA: Insufficient documentation

## 2017-05-12 DIAGNOSIS — Z8619 Personal history of other infectious and parasitic diseases: Secondary | ICD-10-CM | POA: Diagnosis not present

## 2017-05-12 HISTORY — DX: Trigger thumb, right thumb: M65.321

## 2017-05-12 HISTORY — DX: Trigger thumb, right thumb: M65.311

## 2017-05-12 HISTORY — DX: Trigger finger, right ring finger: M65.331

## 2017-05-12 HISTORY — PX: TRIGGER FINGER RELEASE: SHX641

## 2017-05-12 HISTORY — DX: Other complications of anesthesia, initial encounter: T88.59XA

## 2017-05-12 HISTORY — DX: Trigger thumb, right thumb: M65.341

## 2017-05-12 HISTORY — DX: Adverse effect of unspecified anesthetic, initial encounter: T41.45XA

## 2017-05-12 HISTORY — DX: Trigger finger, right little finger: M65.351

## 2017-05-12 SURGERY — RELEASE, A1 PULLEY, FOR TRIGGER FINGER
Anesthesia: Monitor Anesthesia Care | Site: Finger | Laterality: Right

## 2017-05-12 MED ORDER — MEPERIDINE HCL 25 MG/ML IJ SOLN: 6.2500 mg | INTRAMUSCULAR | Status: DC | PRN

## 2017-05-12 MED ORDER — MIDAZOLAM HCL 2 MG/2ML IJ SOLN
INTRAMUSCULAR | Status: AC
  Filled 2017-05-12: qty 2

## 2017-05-12 MED ORDER — OXYCODONE HCL 5 MG PO TABS: 5.0000 mg | ORAL_TABLET | Freq: Once | ORAL | Status: DC | PRN

## 2017-05-12 MED ORDER — LIDOCAINE HCL (PF) 1 % IJ SOLN
INTRAMUSCULAR | Status: AC
  Filled 2017-05-12: qty 30

## 2017-05-12 MED ORDER — MIDAZOLAM HCL 2 MG/2ML IJ SOLN
1.0000 mg | INTRAMUSCULAR | Status: DC | PRN
  Administered 2017-05-12: 2 mg via INTRAVENOUS

## 2017-05-12 MED ORDER — PROPOFOL 10 MG/ML IV BOLUS
INTRAVENOUS | Status: DC | PRN
  Administered 2017-05-12: 20 mg via INTRAVENOUS
  Administered 2017-05-12 (×2): 10 mg via INTRAVENOUS

## 2017-05-12 MED ORDER — CHLORHEXIDINE GLUCONATE CLOTH 2 % EX PADS: 6.0000 | MEDICATED_PAD | Freq: Once | CUTANEOUS | Status: DC

## 2017-05-12 MED ORDER — ONDANSETRON HCL 4 MG/2ML IJ SOLN: 4.0000 mg | Freq: Once | INTRAMUSCULAR | Status: DC | PRN

## 2017-05-12 MED ORDER — KETOROLAC TROMETHAMINE 30 MG/ML IJ SOLN: 30.0000 mg | Freq: Once | INTRAMUSCULAR | Status: DC | PRN

## 2017-05-12 MED ORDER — CEFAZOLIN SODIUM-DEXTROSE 2-4 GM/100ML-% IV SOLN
2.0000 g | INTRAVENOUS | Status: AC
  Administered 2017-05-12: 2 g via INTRAVENOUS

## 2017-05-12 MED ORDER — LIDOCAINE-EPINEPHRINE 0.5 %-1:200000 IJ SOLN
INTRAMUSCULAR | Status: AC
  Filled 2017-05-12: qty 1

## 2017-05-12 MED ORDER — PROPOFOL 10 MG/ML IV BOLUS
INTRAVENOUS | Status: AC
  Filled 2017-05-12: qty 20

## 2017-05-12 MED ORDER — ACETAMINOPHEN 325 MG PO TABS: 325.0000 mg | ORAL_TABLET | ORAL | Status: DC | PRN

## 2017-05-12 MED ORDER — CEFAZOLIN SODIUM-DEXTROSE 2-4 GM/100ML-% IV SOLN
INTRAVENOUS | Status: AC
  Filled 2017-05-12: qty 100

## 2017-05-12 MED ORDER — LACTATED RINGERS IV SOLN
INTRAVENOUS | Status: DC
  Administered 2017-05-12: 09:00:00 via INTRAVENOUS

## 2017-05-12 MED ORDER — FENTANYL CITRATE (PF) 100 MCG/2ML IJ SOLN
50.0000 ug | INTRAMUSCULAR | Status: AC | PRN
  Administered 2017-05-12: 50 ug via INTRAVENOUS
  Administered 2017-05-12 (×2): 25 ug via INTRAVENOUS

## 2017-05-12 MED ORDER — BUPIVACAINE-EPINEPHRINE (PF) 0.25% -1:200000 IJ SOLN
INTRAMUSCULAR | Status: AC
Start: 2017-05-12 — End: ?
  Filled 2017-05-12: qty 30

## 2017-05-12 MED ORDER — OXYCODONE HCL 5 MG/5ML PO SOLN
5.0000 mg | Freq: Once | ORAL | Status: DC | PRN
Start: 2017-05-12 — End: 2017-05-12

## 2017-05-12 MED ORDER — LIDOCAINE HCL (CARDIAC) 20 MG/ML IV SOLN: INTRAVENOUS | Status: DC | PRN

## 2017-05-12 MED ORDER — FENTANYL CITRATE (PF) 100 MCG/2ML IJ SOLN
INTRAMUSCULAR | Status: AC
  Filled 2017-05-12: qty 2

## 2017-05-12 MED ORDER — FENTANYL CITRATE (PF) 100 MCG/2ML IJ SOLN
25.0000 ug | INTRAMUSCULAR | Status: DC | PRN
  Administered 2017-05-12: 25 ug via INTRAVENOUS

## 2017-05-12 MED ORDER — LIDOCAINE HCL (PF) 0.5 % IJ SOLN
INTRAMUSCULAR | Status: DC | PRN
  Administered 2017-05-12: 35 mL via INTRAVENOUS

## 2017-05-12 MED ORDER — ONDANSETRON HCL 4 MG/2ML IJ SOLN
INTRAMUSCULAR | Status: DC | PRN
  Administered 2017-05-12: 4 mg via INTRAVENOUS

## 2017-05-12 MED ORDER — ACETAMINOPHEN 160 MG/5ML PO SOLN: 325.0000 mg | ORAL | Status: DC | PRN

## 2017-05-12 MED ORDER — BUPIVACAINE-EPINEPHRINE (PF) 0.5% -1:200000 IJ SOLN
INTRAMUSCULAR | Status: AC
Start: 2017-05-12 — End: ?
  Filled 2017-05-12: qty 30

## 2017-05-12 MED ORDER — SCOPOLAMINE 1 MG/3DAYS TD PT72: 1.0000 | MEDICATED_PATCH | Freq: Once | TRANSDERMAL | Status: DC | PRN

## 2017-05-12 SURGICAL SUPPLY — 51 items
APL SKNCLS STERI-STRIP NONHPOA (GAUZE/BANDAGES/DRESSINGS) ×1
BANDAGE ACE 3X5.8 VEL STRL LF (GAUZE/BANDAGES/DRESSINGS) ×2 IMPLANT
BENZOIN TINCTURE PRP APPL 2/3 (GAUZE/BANDAGES/DRESSINGS) ×2 IMPLANT
BLADE KNIFE PERSONA 10 (BLADE) IMPLANT
BLADE KNIFE PERSONA 15 (BLADE) ×2 IMPLANT
BNDG CMPR 9X4 STRL LF SNTH (GAUZE/BANDAGES/DRESSINGS)
BNDG CONFORM 3 STRL LF (GAUZE/BANDAGES/DRESSINGS) IMPLANT
BNDG CONFORM 4 STRL LF (GAUZE/BANDAGES/DRESSINGS) ×1 IMPLANT
BNDG ESMARK 4X9 LF (GAUZE/BANDAGES/DRESSINGS) ×1 IMPLANT
BNDG GAUZE ELAST 4 BULKY (GAUZE/BANDAGES/DRESSINGS) IMPLANT
BRUSH SCRUB EZ PLAIN DRY (MISCELLANEOUS) ×1 IMPLANT
CLEANER CAUTERY TIP 5X5 PAD (MISCELLANEOUS) IMPLANT
CORD BIPOLAR FORCEPS 12FT (ELECTRODE) IMPLANT
COVER BACK TABLE 60X90IN (DRAPES) ×2 IMPLANT
COVER MAYO STAND STRL (DRAPES) ×2 IMPLANT
DECANTER SPIKE VIAL GLASS SM (MISCELLANEOUS) IMPLANT
DRAPE EXTREMITY T 121X128X90 (DRAPE) ×2 IMPLANT
DRAPE HALF SHEET 40X57 (DRAPES) ×1 IMPLANT
DRAPE SURG 17X23 STRL (DRAPES) ×2 IMPLANT
ELECT NDL TIP 2.8 STRL (NEEDLE) IMPLANT
ELECT NEEDLE TIP 2.8 STRL (NEEDLE) IMPLANT
ELECT REM PT RETURN 9FT ADLT (ELECTROSURGICAL)
ELECTRODE REM PT RTRN 9FT ADLT (ELECTROSURGICAL) IMPLANT
GAUZE SPONGE 4X4 12PLY STRL (GAUZE/BANDAGES/DRESSINGS) ×2 IMPLANT
GAUZE XEROFORM 1X8 LF (GAUZE/BANDAGES/DRESSINGS) ×2 IMPLANT
GLOVE ECLIPSE 7.0 STRL STRAW (GLOVE) ×2 IMPLANT
GOWN STRL REUS W/ TWL LRG LVL3 (GOWN DISPOSABLE) IMPLANT
GOWN STRL REUS W/ TWL XL LVL3 (GOWN DISPOSABLE) ×1 IMPLANT
GOWN STRL REUS W/TWL LRG LVL3 (GOWN DISPOSABLE)
GOWN STRL REUS W/TWL XL LVL3 (GOWN DISPOSABLE) ×2
MARKER SKIN DUAL TIP RULER LAB (MISCELLANEOUS) ×2 IMPLANT
NDL HYPO 25X1 1.5 SAFETY (NEEDLE) IMPLANT
NEEDLE HYPO 25X1 1.5 SAFETY (NEEDLE) IMPLANT
NS IRRIG 1000ML POUR BTL (IV SOLUTION) ×1 IMPLANT
PACK BASIN DAY SURGERY FS (CUSTOM PROCEDURE TRAY) ×2 IMPLANT
PAD CLEANER CAUTERY TIP 5X5 (MISCELLANEOUS)
PADDING CAST ABS 3INX4YD NS (CAST SUPPLIES)
PADDING CAST ABS COTTON 3X4 (CAST SUPPLIES) IMPLANT
PENCIL BUTTON HOLSTER BLD 10FT (ELECTRODE) IMPLANT
SPLINT UNIVERSAL RIGHT (SOFTGOODS) ×1 IMPLANT
SPLINT WRIST 10 LEFT UNV (SOFTGOODS) IMPLANT
STOCKINETTE 4X48 STRL (DRAPES) ×2 IMPLANT
STRIP SUTURE WOUND CLOSURE 1/2 (SUTURE) IMPLANT
SUT ETHILON 5 0 PS 2 18 (SUTURE) IMPLANT
SUT MNCRL AB 3-0 PS2 18 (SUTURE) IMPLANT
SUT PROLENE 4 0 PS 2 18 (SUTURE) ×1 IMPLANT
SYR BULB 3OZ (MISCELLANEOUS) ×1 IMPLANT
SYR CONTROL 10ML LL (SYRINGE) IMPLANT
TOWEL OR 17X24 6PK STRL BLUE (TOWEL DISPOSABLE) ×2 IMPLANT
TRAY DSU PREP LF (CUSTOM PROCEDURE TRAY) ×2 IMPLANT
UNDERPAD 30X30 (UNDERPADS AND DIAPERS) ×2 IMPLANT

## 2017-05-12 NOTE — Discharge Instructions (Signed)
Activity (include date of return to work if known) As tolerated: NO showers NO driving No heavy activities  Diet:regular No restrictions:  Wound Care: Keep dressing clean & dry  Do not change dressings    Call Doctor if any unusual problems occur such as pain, excessive Bleeding, unrelieved Nausea/vomiting, Fever &/or chills When lying down, keep head elevated on 2-3 pillows or back-rest  Follow-up appointment: Please call the office.  The patient received discharge instruction from:___________________________________________   Patient signature ________________________________________ / Date___________    Signature of individual providing instructions/ Date________________                         HAND SURGERY    HOME CARE INSTRUCTIONS    The following instructions have been prepared to help you care for yourself upon your return home today.  Wound Care:  Keep your hand elevated above the level of your heart. Do not allow it to dangle by your side. Keep the dressing dry and do not remove it unless your doctor advises you to do so. He will usually change it at the time of you post-op visit. Moving your fingers is advised to stimulate circulation but will depend on the site of your surgery. Of course, if you have a splint applied your doctor will advise you about movement.  Activity:  Do not drive or operate machinery today. Rest today and then you may return to your normal activity and work as indicated by your physician.  Diet: Drink liquids today or eat a light diet. You may resume a regular diet tomorrow.  General expectations: Pain for two or three days. Fingers may become slightly swollen.   Unexpected Observations- Call your doctor if any of these occur: Severe pain not relieved by pain medication. Elevated temperature. Dressing soaked with blood. Inability to move fingers. White or bluish color to fingers.        Post Anesthesia Home Care  Instructions  Activity: Get plenty of rest for the remainder of the day. A responsible individual must stay with you for 24 hours following the procedure.  For the next 24 hours, DO NOT: -Drive a car -Paediatric nurse -Drink alcoholic beverages -Take any medication unless instructed by your physician -Make any legal decisions or sign important papers.  Meals: Start with liquid foods such as gelatin or soup. Progress to regular foods as tolerated. Avoid greasy, spicy, heavy foods. If nausea and/or vomiting occur, drink only clear liquids until the nausea and/or vomiting subsides. Call your physician if vomiting continues.  Special Instructions/Symptoms: Your throat may feel dry or sore from the anesthesia or the breathing tube placed in your throat during surgery. If this causes discomfort, gargle with warm salt water. The discomfort should disappear within 24 hours.  If you had a scopolamine patch placed behind your ear for the management of post- operative nausea and/or vomiting:  1. The medication in the patch is effective for 72 hours, after which it should be removed.  Wrap patch in a tissue and discard in the trash. Wash hands thoroughly with soap and water. 2. You may remove the patch earlier than 72 hours if you experience unpleasant side effects which may include dry mouth, dizziness or visual disturbances. 3. Avoid touching the patch. Wash your hands with soap and water after contact with the patch.

## 2017-05-12 NOTE — Addendum Note (Signed)
Addendum  created 05/12/17 1215 by Janeece Riggers, MD   Order list changed, Order sets accessed

## 2017-05-12 NOTE — Anesthesia Preprocedure Evaluation (Addendum)
Anesthesia Evaluation  Patient identified by MRN, date of birth, ID band Patient awake    Reviewed: Allergy & Precautions, NPO status , Patient's Chart, lab work & pertinent test results  History of Anesthesia Complications (+) history of anesthetic complications  Airway Mallampati: II  TM Distance: >3 FB Neck ROM: Full    Dental no notable dental hx. (+) Teeth Intact, Caps   Pulmonary neg pulmonary ROS, former smoker,    Pulmonary exam normal breath sounds clear to auscultation       Cardiovascular negative cardio ROS Normal cardiovascular exam Rhythm:Regular Rate:Normal     Neuro/Psych negative neurological ROS  negative psych ROS   GI/Hepatic negative GI ROS, Neg liver ROS,   Endo/Other  negative endocrine ROS  Renal/GU negative Renal ROS  negative genitourinary   Musculoskeletal negative musculoskeletal ROS (+)   Abdominal   Peds negative pediatric ROS (+)  Hematology negative hematology ROS (+)   Anesthesia Other Findings   Reproductive/Obstetrics negative OB ROS                             Anesthesia Physical Anesthesia Plan  ASA: II  Anesthesia Plan: MAC   Post-op Pain Management:    Induction: Intravenous  PONV Risk Score and Plan: 2 and Treatment may vary due to age or medical condition  Airway Management Planned: Mask, Natural Airway and Nasal Cannula  Additional Equipment:   Intra-op Plan:   Post-operative Plan:   Informed Consent: I have reviewed the patients History and Physical, chart, labs and discussed the procedure including the risks, benefits and alternatives for the proposed anesthesia with the patient or authorized representative who has indicated his/her understanding and acceptance.     Plan Discussed with: CRNA and Anesthesiologist  Anesthesia Plan Comments:         Anesthesia Quick Evaluation

## 2017-05-12 NOTE — Transfer of Care (Signed)
Immediate Anesthesia Transfer of Care Note  Patient: Sheri Kelly  Procedure(s) Performed: RIGHT TRIGGER FINGER RELEASE (Right Finger)  Patient Location: PACU  Anesthesia Type:MAC and Bier block  Level of Consciousness: awake, alert  and oriented  Airway & Oxygen Therapy: Patient Spontanous Breathing  Post-op Assessment: Report given to RN and Post -op Vital signs reviewed and stable  Post vital signs: Reviewed and stable  Last Vitals:  Vitals:   05/12/17 0836  BP: 118/63  Pulse: (!) 59  Resp: 20  Temp: 36.6 C  SpO2: 100%    Last Pain:  Vitals:   05/12/17 0836  TempSrc: Oral         Complications: No apparent anesthesia complications

## 2017-05-12 NOTE — Brief Op Note (Signed)
05/12/2017  11:43 AM  PATIENT:  [place] Ou  67 y.o. female  PRE-OPERATIVE DIAGNOSIS:  RIGHT MIDDLE TRIGGER FINGER  POST-OPERATIVE DIAGNOSIS:  RIGHT MIDDLE TRIGGER FINGER  PROCEDURE:  Procedure(s): RIGHT TRIGGER FINGER RELEASE (Right)  SURGEON:  Surgeon(s) and Role:    * Cristine Polio, MD - Primary  PHYSICIAN ASSISTANT:   ASSISTANTS: none   ANESTHESIA:   regional  EBL:  1 mL   BLOOD ADMINISTERED:none  DRAINS: none   LOCAL MEDICATIONS USED:  LIDOCAINE   SPECIMEN:  No Specimen  DISPOSITION OF SPECIMEN:  N/A  COUNTS:  YES  TOURNIQUET:   Total Tourniquet Time Documented: Forearm (Right) - 144 minutes Total: Forearm (Right) - 144 minutes   DICTATION: .Other Dictation: Dictation Number [id number]  PLAN OF CARE: Discharge to home after PACU  PATIENT DISPOSITION:  PACU - hemodynamically stable.   Delay start of Pharmacological VTE agent (>24hrs) due to surgical blood loss or risk of bleeding: yes

## 2017-05-12 NOTE — Anesthesia Procedure Notes (Signed)
Anesthesia Regional Block: Bier block (IV Regional)   Pre-Anesthetic Checklist: ,, timeout performed, Correct Patient, Correct Site, Correct Laterality, Correct Procedure,, site marked, surgical consent,, at surgeon's request Needles:  Injection technique: Single-shot  Needle Type: Other      Needle Gauge: 20     Additional Needles:   Procedures:,,,,, intact distal pulses, Esmarch exsanguination, single tourniquet utilized,  Narrative:   Performed by: [employer]

## 2017-05-12 NOTE — Anesthesia Procedure Notes (Signed)
Procedure Name: MAC Performed by: Terrance Mass, CRNA Pre-anesthesia Checklist: Patient identified, Timeout performed, Emergency Drugs available, Suction available and Patient being monitored Patient Re-evaluated:Patient Re-evaluated prior to induction Oxygen Delivery Method: Simple face mask

## 2017-05-12 NOTE — Anesthesia Postprocedure Evaluation (Signed)
Anesthesia Post Note  Patient: Sheri Kelly  Procedure(s) Performed: RIGHT TRIGGER FINGER RELEASE (Right Finger)     Patient location during evaluation: PACU Anesthesia Type: MAC Level of consciousness: awake and alert Pain management: pain level controlled Vital Signs Assessment: post-procedure vital signs reviewed and stable Respiratory status: spontaneous breathing, nonlabored ventilation, respiratory function stable and patient connected to nasal cannula oxygen Cardiovascular status: stable and blood pressure returned to baseline Postop Assessment: no apparent nausea or vomiting Anesthetic complications: no    Last Vitals:  Vitals:   05/12/17 1145 05/12/17 1146  BP: 107/62   Pulse: 61   Resp: 10   Temp: 36.9 C   SpO2: 96% 98%    Last Pain:  Vitals:   05/12/17 1145  TempSrc:   PainSc: 0-No pain                 ODDONO,ERNEST

## 2017-05-13 ENCOUNTER — Encounter (HOSPITAL_BASED_OUTPATIENT_CLINIC_OR_DEPARTMENT_OTHER): Payer: Self-pay | Admitting: Specialist

## 2017-05-13 NOTE — Op Note (Signed)
NAME:  Perillo, Caniyah                ACCOUNT NO.:  [id number]  MEDICAL RECORD NO.:  [id number]  LOCATION:                                 FACILITY:  PHYSICIAN:  Berneta Sages L. Towanda Malkin, M.D.   DATE OF BIRTH:  DATE OF PROCEDURE:  05/12/2017 DATE OF DISCHARGE:  05/12/2017                              OPERATIVE REPORT   A 67 year old lady with a right middle trigger finger.  It has been increasing in frequency and pain and discomfort.  PROCEDURE DONE:  Release of A1 pulley.  ANESTHESIA:  Bier block anesthesia.  The patient underwent Bier block anesthesia.  Prep was done to the right hand and arm areas using Betadine soap and solution, walled off with sterile towels and drapes so as make a sterile field.  Tourniquet was placed and placed on 180 mm after it had been exsanguinated.  An incision was carried out across the MP joint crease line with #15 blade on underlying skin, subcutaneous tissue to the underlying pulley system and tendon sheath.  A1 pulley system was identified and released approximately 50% to 60% of its diameter, releasing the trigger finger in with manual manipulation.  After this, the skin edges reapproximated with interrupted 4-0 Prolene suture.  Sterile dressings were placed on the finger and joint.  The hand was held in elevation until the tourniquet was released down to 0 and wrapped with 1-inch Kerlix wraps and 4 x 4's and a wrist splint.  She tolerated the procedure very well, was taken to Recovery in excellent condition.     Odella Aquas. Towanda Malkin, M.D.     Elie Confer  D:  05/12/2017  T:  05/12/2017  Job:  [id number]

## 2017-06-01 DIAGNOSIS — T8130XA Disruption of wound, unspecified, initial encounter: Secondary | ICD-10-CM | POA: Diagnosis not present

## 2017-06-06 DIAGNOSIS — M1612 Unilateral primary osteoarthritis, left hip: Secondary | ICD-10-CM | POA: Diagnosis not present

## 2017-06-19 DIAGNOSIS — M1612 Unilateral primary osteoarthritis, left hip: Secondary | ICD-10-CM | POA: Diagnosis not present

## 2017-06-25 ENCOUNTER — Ambulatory Visit (INDEPENDENT_AMBULATORY_CARE_PROVIDER_SITE_OTHER): Payer: Medicare Other | Admitting: Family Medicine

## 2017-06-25 ENCOUNTER — Encounter: Payer: Self-pay | Admitting: Family Medicine

## 2017-06-25 VITALS — BP 113/72 | HR 50 | Temp 98.2°F | Resp 20 | Ht 67.0 in | Wt 173.5 lb

## 2017-06-25 DIAGNOSIS — T8130XA Disruption of wound, unspecified, initial encounter: Secondary | ICD-10-CM | POA: Diagnosis not present

## 2017-06-25 NOTE — Patient Instructions (Signed)
Schedule a procedure appt tomorrow and we will debride the area for you.   It is not infected and looks good (underneath). Keep caring for it as you are.

## 2017-06-25 NOTE — Progress Notes (Signed)
Sheri Kelly , [birth year], 67 y.o., female MRN: [id number] Patient Care Team    Relationship Specialty Notifications Start End  Sheri Hillock, DO PCP - General Family Medicine  09/25/16   Sheri Castle, MD (Inactive) Consulting Physician Gastroenterology  09/25/16   Sheri Graff, MD Consulting Physician Obstetrics and Gynecology  09/25/16   Sheri Kelly, OD  Optometry  09/25/16    Comment: Sheri [facility] [employer] [facility] [facility]    Chief Complaint  Patient presents with  . Wound Dehiscence    not healing     Subjective: Pt presents for an OV with complaints of  Wound complication after trigger finger release 05/12/2017 by Dr. Lennette Bihari. She reports she had followed up with Dr. Neita Kelly office after surgery. When sutures were removed wound was not approximated. She has been placing sterile gauzes and antibiotic cream over daily. She denies fever, drainage or redness surrounding the site. She is concerned it is still not completely healed and reports discomfort.  Depression screen [facility] 2/9 09/25/2016 08/02/2015  Decreased Interest 0 0  Down, Depressed, Hopeless 0 0  PHQ - 2 Score 0 0    Allergies  Allergen Reactions  . Dust Mite Extract   . Molds & Smuts    Social History   Tobacco Use  . Smoking status: Former Smoker    Last attempt to quit: 09/11/2009    Years since quitting: 7.7  . Smokeless tobacco: Never Used  Substance Use Topics  . Alcohol use: Yes    Alcohol/week: 3.0 oz    Types: 5 Glasses of wine per week    Comment: social   Past Medical History:  Diagnosis Date  . Arthritis   . Cervical dysplasia   . Cervical polyp 01/14/2008  . Complication of anesthesia    had ileus after hysterectomy 2011  . History of breast reconstruction    Bilateral   . History of chicken pox   . History of measles, mumps, or rubella   . Infertility, female   . Menopausal symptoms   . Trigger finger of all digits of right hand    RMF  . Uterine fibroid    Past Surgical History:    Procedure Laterality Date  . ABDOMINAL HYSTERECTOMY  09/19/2009-09/22/2009   total  . BREAST SURGERY  1998   "breast lift"  . CESAREAN SECTION      x 2  . INTRAOCULAR LENS INSERTION Bilateral   . MOUTH SURGERY    . SALPINGOOPHORECTOMY  09/19/2009   right  . TRIGGER FINGER RELEASE Right 05/12/2017   Procedure: RIGHT TRIGGER FINGER RELEASE;  Surgeon: Sheri Polio, MD;  Location: [place];  Service: Plastics;  Laterality: Right;  . TUBAL LIGATION     bilateral  . UTERINE FIBROID SURGERY     Family History  Problem Relation Age of Onset  . Arthritis Mother   . Breast cancer Mother   . Lung cancer Father   . Hypertension Brother   . Throat cancer Brother   . Leukemia Sister    Allergies as of 06/25/2017      Reactions   Dust Mite Extract    Molds & Smuts       Medication List        Accurate as of 06/25/17  9:51 AM. Always use your most recent med list.          cholecalciferol 1000 units tablet Commonly known as:  VITAMIN D Take 1,000 Units by mouth daily.  CURCUMIN 95 PO Take by mouth.   fluticasone 50 MCG/ACT nasal spray Commonly known as:  FLONASE Place into both nostrils daily.   glucosamine-chondroitin 500-400 MG tablet Take 1 tablet by mouth 3 (three) times daily.   MAG-200 PO Take by mouth.   multivitamin tablet Take 1 tablet by mouth daily.   OMEGA 3-6-9 COMPLEX PO Take 1 capsule by mouth daily.   vitamin B-12 1000 MCG tablet Commonly known as:  CYANOCOBALAMIN Take 1,000 mcg by mouth daily.       All past medical history, surgical history, allergies, family history, immunizations andmedications were updated in the EMR today and reviewed under the history and medication portions of their EMR.     ROS: Negative, with the exception of above mentioned in HPI   Objective:  BP 113/72 (BP Location: Left Arm, Patient Position: Sitting, Cuff Size: Large)   Pulse (!) 50   Temp 98.2 F (36.8 C)   Resp 20   Ht 5\' 7"  (1.702 m)    Wt 173 lb 8 oz (78.7 kg)   SpO2 99%   BMI 27.17 kg/m  Body mass index is 27.17 kg/m. Gen: Afebrile. No acute distress. Nontoxic in appearance, well developed, well nourished.  HENT: AT. [place].  MMM, no oral lesions.  MSK: right, middle finger incision dehiscence without redness, swelling or drainage. good granulation healing. Wound edges with cutaneous access. Skin: no rashes, purpura or petechiae.  Neuro:  Normal gait. PERLA. EOMi. Alert. Oriented x3 No exam data present No results found. No results found for this or any previous visit (from the past 24 hour(s)).  Assessment/Plan: Sheri Kelly is a 67 y.o. female present for OV for  Wound dehiscence Wound does not appear infected today.It actually appears to behealing well by granulation. Wound is not open. I believe the areas of concern causing discomfort to her are actually excess cutaneous flaps surrounding granulated tissue. Discussed this with her today and agreed to gently debride edges of wound in office. And she is agreeable to plan. Will reschedule for tomorrow 30 minute procedure appointment Reassured her, not infectious.   Reviewed expectations re: course of current medical issues.  Discussed self-management of symptoms.  Outlined signs and symptoms indicating need for more acute intervention.  Patient verbalized understanding and all questions were answered.  Patient received an After-Visit Summary.    No orders of the defined types were placed in this encounter.    Note is dictated utilizing voice recognition software. Although note has been proof read prior to signing, occasional typographical errors still can be missed. If any questions arise, please do not hesitate to call for verification.   electronically signed by:  Howard Pouch, DO  [place]

## 2017-06-26 DIAGNOSIS — Z6826 Body mass index (BMI) 26.0-26.9, adult: Secondary | ICD-10-CM | POA: Diagnosis not present

## 2017-06-26 DIAGNOSIS — M25552 Pain in left hip: Secondary | ICD-10-CM | POA: Diagnosis not present

## 2017-06-26 DIAGNOSIS — M2550 Pain in unspecified joint: Secondary | ICD-10-CM | POA: Diagnosis not present

## 2017-06-26 DIAGNOSIS — E663 Overweight: Secondary | ICD-10-CM | POA: Diagnosis not present

## 2017-06-26 DIAGNOSIS — M150 Primary generalized (osteo)arthritis: Secondary | ICD-10-CM | POA: Diagnosis not present

## 2017-06-27 ENCOUNTER — Ambulatory Visit: Payer: Medicare Other | Admitting: Family Medicine

## 2017-07-04 DIAGNOSIS — M1612 Unilateral primary osteoarthritis, left hip: Secondary | ICD-10-CM | POA: Diagnosis not present

## 2017-08-21 ENCOUNTER — Other Ambulatory Visit: Payer: Self-pay | Admitting: Orthopedic Surgery

## 2017-08-21 DIAGNOSIS — M1612 Unilateral primary osteoarthritis, left hip: Secondary | ICD-10-CM

## 2017-08-22 ENCOUNTER — Ambulatory Visit
Admission: RE | Admit: 2017-08-22 | Discharge: 2017-08-22 | Disposition: A | Discharge status: Home / Self Care | Payer: Medicare Other | Source: Ambulatory Visit | Attending: Orthopedic Surgery | Admitting: Orthopedic Surgery

## 2017-08-22 DIAGNOSIS — M1612 Unilateral primary osteoarthritis, left hip: Secondary | ICD-10-CM

## 2017-08-22 DIAGNOSIS — M160 Bilateral primary osteoarthritis of hip: Secondary | ICD-10-CM | POA: Diagnosis not present

## 2017-09-12 DIAGNOSIS — M25552 Pain in left hip: Secondary | ICD-10-CM | POA: Diagnosis not present

## 2017-09-25 NOTE — Progress Notes (Addendum)
Subjective:   Sheri Kelly is a 67 y.o. female who presents for Medicare Annual (Subsequent) preventive examination.  Review of Systems:  No ROS.  Medicare Wellness Visit. Additional risk factors are reflected in the social history.  Cardiac Risk Factors include: advanced age (>77men, >23 women)   Sleep patterns: Sleeps 10-12 hours.  Home Safety/Smoke Alarms: Feels safe in home. Smoke alarms in place.  Living environment; residence and Firearm Safety: Lives with son in 2 story home, rail in place.  Seat Belt Safety/Bike Helmet: Wears seat belt.   Female:   Pap-N/A       Mammo-09/03/2016, BI-RADS CATEGORY  1: Negative. Will schedule in the Fall.     Dexa scan-11/07/2016, normal.      CCS-09/18/2010, diverticulosis. Recall 10 years.      Objective:     Vitals: BP (!) 106/58 (BP Location: Left Arm, Patient Position: Sitting, Cuff Size: Normal)   Pulse (!) 54   Ht 5\' 7"  (1.702 m)   Wt 169 lb 2 oz (76.7 kg)   SpO2 95%   BMI 26.49 kg/m   Body mass index is 26.49 kg/m.  Advanced Directives 09/26/2017 05/12/2017 05/06/2017  Does Patient Have a Medical Advance Directive? Yes Yes Yes  Type of Advance Directive Living will;Healthcare Power of Attorney - Living will;Healthcare Power of Attorney  Does patient want to make changes to medical advance directive? - No - Patient declined -  Copy of [place] in Chart? No - copy requested - -    Tobacco Social History   Tobacco Use  Smoking Status Former Smoker  . Last attempt to quit: 09/11/2009  . Years since quitting: 8.0  Smokeless Tobacco Never Used     Counseling given: Not Answered    Past Medical History:  Diagnosis Date  . Arthritis   . Cervical dysplasia   . Cervical polyp 01/14/2008  . Complication of anesthesia    had ileus after hysterectomy 2011  . History of breast reconstruction    Bilateral   . History of chicken pox   . History of measles, mumps, or rubella   . Infertility, female   .  Menopausal symptoms   . Trigger finger of all digits of right hand    RMF  . Uterine fibroid    Past Surgical History:  Procedure Laterality Date  . ABDOMINAL HYSTERECTOMY  09/19/2009-09/22/2009   total  . BREAST SURGERY  1998   "breast lift"  . CESAREAN SECTION      x 2  . INTRAOCULAR LENS INSERTION Bilateral   . MOUTH SURGERY    . SALPINGOOPHORECTOMY  09/19/2009   right  . TRIGGER FINGER RELEASE Right 05/12/2017   Procedure: RIGHT TRIGGER FINGER RELEASE;  Surgeon: Cristine Polio, MD;  Location: [place];  Service: Plastics;  Laterality: Right;  . TUBAL LIGATION     bilateral  . UTERINE FIBROID SURGERY     Family History  Problem Relation Age of Onset  . Arthritis Mother   . Breast cancer Mother   . Lung cancer Father   . Hypertension Brother   . Throat cancer Brother   . Leukemia Sister   . Hypertension Son    Social History   Socioeconomic History  . Marital status: Divorced    Spouse name: Not on file  . Number of children: 1  . Years of education: 7  . Highest education level: Not on file  Occupational History  . Occupation: Armed forces operational officer- [employer]  Social Needs  . Financial resource strain: Not on file  . Food insecurity:    Worry: Not on file    Inability: Not on file  . Transportation needs:    Medical: Not on file    Non-medical: Not on file  Tobacco Use  . Smoking status: Former Smoker    Last attempt to quit: 09/11/2009    Years since quitting: 8.0  . Smokeless tobacco: Never Used  Substance and Sexual Activity  . Alcohol use: Yes    Alcohol/week: 3.0 oz    Types: 5 Glasses of wine per week    Comment: social  . Drug use: No  . Sexual activity: Never  Lifestyle  . Physical activity:    Days per week: Not on file    Minutes per session: Not on file  . Stress: Not on file  Relationships  . Social connections:    Talks on phone: Not on file    Gets together: Not on file    Attends religious service: Not on file     Active member of club or organization: Not on file    Attends meetings of clubs or organizations: Not on file    Relationship status: Not on file  Other Topics Concern  . Not on file  Social History Narrative   Divorced. 1 child (Sheri Kelly).   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    Drinks caffeine, uses herbal remedies. Takes a daily vitamin.    Wears seatbelt, bicycle helmet and dentures.    Smoke detector in the home. Firearms locked in the home.    Feels safe in her relationships.     Outpatient Encounter Medications as of 09/26/2017  Medication Sig  . cholecalciferol (VITAMIN D) 1000 units tablet Take 1,000 Units by mouth daily.  . fluticasone (FLONASE) 50 MCG/ACT nasal spray Place into both nostrils daily.  Marland Kitchen glucosamine-chondroitin 500-400 MG tablet Take 1 tablet by mouth 3 (three) times daily.  . Magnesium Oxide (MAG-200 PO) Take by mouth.  . Multiple Vitamin (MULTIVITAMIN) tablet Take 1 tablet by mouth daily.    . Omega 3-6-9 Fatty Acids (OMEGA 3-6-9 COMPLEX PO) Take 1 capsule by mouth daily.    . Turmeric (CURCUMIN 95 PO) Take by mouth.  . vitamin B-12 (CYANOCOBALAMIN) 1000 MCG tablet Take 1,000 mcg by mouth daily.   No facility-administered encounter medications on file as of 09/26/2017.     Activities of Daily Living In your present state of health, do you have any difficulty performing the following activities: 09/26/2017 05/12/2017  Hearing? N N  Vision? N N  Difficulty concentrating or making decisions? N N  Walking or climbing stairs? N N  Dressing or bathing? N N  Doing errands, shopping? N -  Preparing Food and eating ? N -  Using the Toilet? N -  In the past six months, have you accidently leaked urine? N -  Do you have problems with loss of bowel control? N -  Managing your Medications? N -  Managing your Finances? N -  Housekeeping or managing your Housekeeping? N -  Some recent data might be hidden    Patient Care Team: Ma Hillock, DO as PCP - General  (Family Medicine) Inda Castle, MD (Inactive) as Consulting Physician (Gastroenterology) Everett Graff, MD as Consulting Physician (Obstetrics and Gynecology) Laurence Aly, OD (Optometry) Gaynelle Arabian, MD as Consulting Physician (Orthopedic Surgery)    Assessment:   This is a routine wellness examination for [place].  Exercise Activities and Dietary recommendations Current Exercise Habits: Structured exercise class(Gardening/housekeeping), Type of exercise: yoga, Exercise limited by: None identified   Diet (meal preparation, eat out, water intake, caffeinated beverages, dairy products, fruits and vegetables): Drinks water.   Breakfast: tossed salad; fruit; sausage, french toast; coffee Lunch: chicken, salad, vegetables Dinner: pinto beans, crock pot meals.   Goals    . Weight (lb) < 164 lb (74.4 kg)     Lose 7 pounds by not eating past 5 pm, cut back on pasta intake.        Fall Risk Fall Risk  09/26/2017 09/25/2016 08/02/2015  Falls in the past year? No No No    Depression Screen PHQ 2/9 Scores 09/26/2017 09/25/2016 08/02/2015  PHQ - 2 Score 0 0 0     Cognitive Function       Ad8 score reviewed for issues:  Issues making decisions: no  Less interest in hobbies / activities: no  Repeats questions, stories (family complaining): no  Trouble using ordinary gadgets (microwave, computer, phone): no  Forgets the month or year: no  Mismanaging finances: no  Remembering appts: no  Daily problems with thinking and/or memory: no Ad8 score is=0     Immunization History  Administered Date(s) Administered  . Influenza,inj,quad, With Preservative 11/13/2016  . Pneumococcal Conjugate-13 09/25/2016    Screening Tests Health Maintenance  Topic Date Due  . PNA vac Low Risk Adult (2 of 2 - PPSV23) 09/25/2017  . INFLUENZA VACCINE  11/13/2017  . MAMMOGRAM  09/04/2018  . COLONOSCOPY  09/17/2020  . TETANUS/TDAP  09/26/2026  . DEXA SCAN  Completed  . Hepatitis C  Screening  Completed        Plan:     Schedule  Mammogram.   Bring a copy of your living will and/or healthcare power of attorney to your next office visit.  Continue doing brain stimulating activities (puzzles, reading, adult coloring books, staying active) to keep memory sharp.   I have personally reviewed and noted the following in the patient's chart:   . Medical and social history . Use of alcohol, tobacco or illicit drugs  . Current medications and supplements . Functional ability and status . Nutritional status . Physical activity . Advanced directives . List of other physicians . Hospitalizations, surgeries, and ER visits in previous 12 months . Vitals . Screenings to include cognitive, depression, and falls . Referrals and appointments  In addition, I have reviewed and discussed with patient certain preventive protocols, quality metrics, and best practice recommendations. A written personalized care plan for preventive services as well as general preventive health recommendations were provided to patient.     Gerilyn Nestle, RN  09/26/2017  PCP Notes: -Declines PPSV23 and Shingrix today  -Pt requesting lipid panel, willing to pay out of pocket if needed.  -F/U with PCP 11/2017.   Medical screening examination/treatment/procedure(s) were performed by non-physician practitioner and as supervising physician I was immediately available for consultation/collaboration.  I agree with above assessment and plan.  Electronically Signed by: Howard Pouch, DO [facility] [place]

## 2017-09-26 ENCOUNTER — Other Ambulatory Visit: Payer: Self-pay

## 2017-09-26 ENCOUNTER — Ambulatory Visit (INDEPENDENT_AMBULATORY_CARE_PROVIDER_SITE_OTHER): Payer: Medicare Other

## 2017-09-26 VITALS — BP 106/58 | HR 54 | Ht 67.0 in | Wt 169.1 lb

## 2017-09-26 DIAGNOSIS — Z0000 Encounter for general adult medical examination without abnormal findings: Secondary | ICD-10-CM

## 2017-09-26 NOTE — Patient Instructions (Addendum)
Schedule  Mammogram.   Bring a copy of your living will and/or healthcare power of attorney to your next office visit.  Continue doing brain stimulating activities (puzzles, reading, adult coloring books, staying active) to keep memory sharp.   Health Maintenance, Female Adopting a healthy lifestyle and getting preventive care can go a long way to promote health and wellness. Talk with your health care provider about what schedule of regular examinations is right for you. This is a good chance for you to check in with your provider about disease prevention and staying healthy. In between checkups, there are plenty of things you can do on your own. Experts have done a lot of research about which lifestyle changes and preventive measures are most likely to keep you healthy. Ask your health care provider for more information. Weight and diet Eat a healthy diet  Be sure to include plenty of vegetables, fruits, low-fat dairy products, and lean protein.  Do not eat a lot of foods high in solid fats, added sugars, or salt.  Get regular exercise. This is one of the most important things you can do for your health. ? Most adults should exercise for at least 150 minutes each week. The exercise should increase your heart rate and make you sweat (moderate-intensity exercise). ? Most adults should also do strengthening exercises at least twice a week. This is in addition to the moderate-intensity exercise.  Maintain a healthy weight  Body mass index (BMI) is a measurement that can be used to identify possible weight problems. It estimates body fat based on height and weight. Your health care provider can help determine your BMI and help you achieve or maintain a healthy weight.  For females 25 years of age and older: ? A BMI below 18.5 is considered underweight. ? A BMI of 18.5 to 24.9 is normal. ? A BMI of 25 to 29.9 is considered overweight. ? A BMI of 30 and above is considered obese.  Watch  levels of cholesterol and blood lipids  You should start having your blood tested for lipids and cholesterol at 67 years of age, then have this test every 5 years.  You may need to have your cholesterol levels checked more often if: ? Your lipid or cholesterol levels are high. ? You are older than 67 years of age. ? You are at high risk for heart disease.  Cancer screening Lung Cancer  Lung cancer screening is recommended for adults 65-74 years old who are at high risk for lung cancer because of a history of smoking.  A yearly low-dose CT scan of the lungs is recommended for people who: ? Currently smoke. ? Have quit within the past 15 years. ? Have at least a 30-pack-year history of smoking. A pack year is smoking an average of one pack of cigarettes a day for 1 year.  Yearly screening should continue until it has been 15 years since you quit.  Yearly screening should stop if you develop a health problem that would prevent you from having lung cancer treatment.  Breast Cancer  Practice breast self-awareness. This means understanding how your breasts normally appear and feel.  It also means doing regular breast self-exams. Let your health care provider know about any changes, no matter how small.  If you are in your 20s or 30s, you should have a clinical breast exam (CBE) by a health care provider every 1-3 years as part of a regular health exam.  If you are 40 or  older, have a CBE every year. Also consider having a breast X-ray (mammogram) every year.  If you have a family history of breast cancer, talk to your health care provider about genetic screening.  If you are at high risk for breast cancer, talk to your health care provider about having an MRI and a mammogram every year.  Breast cancer gene (BRCA) assessment is recommended for women who have family members with BRCA-related cancers. BRCA-related cancers include: ? Breast. ? Ovarian. ? Tubal. ? Peritoneal  cancers.  Results of the assessment will determine the need for genetic counseling and BRCA1 and BRCA2 testing.  Cervical Cancer Your health care provider may recommend that you be screened regularly for cancer of the pelvic organs (ovaries, uterus, and vagina). This screening involves a pelvic examination, including checking for microscopic changes to the surface of your cervix (Pap test). You may be encouraged to have this screening done every 3 years, beginning at age 83.  For women ages 57-65, health care providers may recommend pelvic exams and Pap testing every 3 years, or they may recommend the Pap and pelvic exam, combined with testing for human papilloma virus (HPV), every 5 years. Some types of HPV increase your risk of cervical cancer. Testing for HPV may also be done on women of any age with unclear Pap test results.  Other health care providers may not recommend any screening for nonpregnant women who are considered low risk for pelvic cancer and who do not have symptoms. Ask your health care provider if a screening pelvic exam is right for you.  If you have had past treatment for cervical cancer or a condition that could lead to cancer, you need Pap tests and screening for cancer for at least 20 years after your treatment. If Pap tests have been discontinued, your risk factors (such as having a new sexual partner) need to be reassessed to determine if screening should resume. Some women have medical problems that increase the chance of getting cervical cancer. In these cases, your health care provider may recommend more frequent screening and Pap tests.  Colorectal Cancer  This type of cancer can be detected and often prevented.  Routine colorectal cancer screening usually begins at 67 years of age and continues through 67 years of age.  Your health care provider may recommend screening at an earlier age if you have risk factors for colon cancer.  Your health care provider may also  recommend using home test kits to check for hidden blood in the stool.  A small camera at the end of a tube can be used to examine your colon directly (sigmoidoscopy or colonoscopy). This is done to check for the earliest forms of colorectal cancer.  Routine screening usually begins at age 43.  Direct examination of the colon should be repeated every 5-10 years through 67 years of age. However, you may need to be screened more often if early forms of precancerous polyps or small growths are found.  Skin Cancer  Check your skin from head to toe regularly.  Tell your health care provider about any new moles or changes in moles, especially if there is a change in a mole's shape or color.  Also tell your health care provider if you have a mole that is larger than the size of a pencil eraser.  Always use sunscreen. Apply sunscreen liberally and repeatedly throughout the day.  Protect yourself by wearing long sleeves, pants, a wide-brimmed hat, and sunglasses whenever you are outside.  Heart disease, diabetes, and high blood pressure  High blood pressure causes heart disease and increases the risk of stroke. High blood pressure is more likely to develop in: ? People who have blood pressure in the high end of the normal range (130-139/85-89 mm Hg). ? People who are overweight or obese. ? People who are African American.  If you are 70-78 years of age, have your blood pressure checked every 3-5 years. If you are 24 years of age or older, have your blood pressure checked every year. You should have your blood pressure measured twice-once when you are at a hospital or clinic, and once when you are not at a hospital or clinic. Record the average of the two measurements. To check your blood pressure when you are not at a hospital or clinic, you can use: ? An automated blood pressure machine at a pharmacy. ? A home blood pressure monitor.  If you are between 77 years and 5 years old, ask your  health care provider if you should take aspirin to prevent strokes.  Have regular diabetes screenings. This involves taking a blood sample to check your fasting blood sugar level. ? If you are at a normal weight and have a low risk for diabetes, have this test once every three years after 67 years of age. ? If you are overweight and have a high risk for diabetes, consider being tested at a younger age or more often. Preventing infection Hepatitis B  If you have a higher risk for hepatitis B, you should be screened for this virus. You are considered at high risk for hepatitis B if: ? You were born in a country where hepatitis B is common. Ask your health care provider which countries are considered high risk. ? Your parents were born in a high-risk country, and you have not been immunized against hepatitis B (hepatitis B vaccine). ? You have HIV or AIDS. ? You use needles to inject street drugs. ? You live with someone who has hepatitis B. ? You have had sex with someone who has hepatitis B. ? You get hemodialysis treatment. ? You take certain medicines for conditions, including cancer, organ transplantation, and autoimmune conditions.  Hepatitis C  Blood testing is recommended for: ? Everyone born from [birth year] through 1965. ? Anyone with known risk factors for hepatitis C.  Sexually transmitted infections (STIs)  You should be screened for sexually transmitted infections (STIs) including gonorrhea and chlamydia if: ? You are sexually active and are younger than 67 years of age. ? You are older than 67 years of age and your health care provider tells you that you are at risk for this type of infection. ? Your sexual activity has changed since you were last screened and you are at an increased risk for chlamydia or gonorrhea. Ask your health care provider if you are at risk.  If you do not have HIV, but are at risk, it may be recommended that you take a prescription medicine daily to  prevent HIV infection. This is called pre-exposure prophylaxis (PrEP). You are considered at risk if: ? You are sexually active and do not regularly use condoms or know the HIV status of your partner(s). ? You take drugs by injection. ? You are sexually active with a partner who has HIV.  Talk with your health care provider about whether you are at high risk of being infected with HIV. If you choose to begin PrEP, you should first be tested for HIV.  You should then be tested every 3 months for as long as you are taking PrEP. Pregnancy  If you are premenopausal and you may become pregnant, ask your health care provider about preconception counseling.  If you may become pregnant, take 400 to 800 micrograms (mcg) of folic acid every day.  If you want to prevent pregnancy, talk to your health care provider about birth control (contraception). Osteoporosis and menopause  Osteoporosis is a disease in which the bones lose minerals and strength with aging. This can result in serious bone fractures. Your risk for osteoporosis can be identified using a bone density scan.  If you are 12 years of age or older, or if you are at risk for osteoporosis and fractures, ask your health care provider if you should be screened.  Ask your health care provider whether you should take a calcium or vitamin D supplement to lower your risk for osteoporosis.  Menopause may have certain physical symptoms and risks.  Hormone replacement therapy may reduce some of these symptoms and risks. Talk to your health care provider about whether hormone replacement therapy is right for you. Follow these instructions at home:  Schedule regular health, dental, and eye exams.  Stay current with your immunizations.  Do not use any tobacco products including cigarettes, chewing tobacco, or electronic cigarettes.  If you are pregnant, do not drink alcohol.  If you are breastfeeding, limit how much and how often you drink  alcohol.  Limit alcohol intake to no more than 1 drink per day for nonpregnant women. One drink equals 12 ounces of beer, 5 ounces of wine, or 1 ounces of hard liquor.  Do not use street drugs.  Do not share needles.  Ask your health care provider for help if you need support or information about quitting drugs.  Tell your health care provider if you often feel depressed.  Tell your health care provider if you have ever been abused or do not feel safe at home. This information is not intended to replace advice given to you by your health care provider. Make sure you discuss any questions you have with your health care provider. Document Released: 10/15/2010 Document Revised: 09/07/2015 Document Reviewed: 01/03/2015 Elsevier Interactive Patient Education  [employer].

## 2017-11-26 ENCOUNTER — Encounter: Payer: Medicare Other | Admitting: Family Medicine

## 2017-12-03 ENCOUNTER — Encounter: Payer: Self-pay | Admitting: Family Medicine

## 2017-12-03 ENCOUNTER — Ambulatory Visit (INDEPENDENT_AMBULATORY_CARE_PROVIDER_SITE_OTHER): Payer: Medicare Other | Admitting: Family Medicine

## 2017-12-03 VITALS — BP 107/64 | HR 54 | Resp 16 | Ht 67.0 in | Wt 164.0 lb

## 2017-12-03 DIAGNOSIS — E663 Overweight: Secondary | ICD-10-CM

## 2017-12-03 DIAGNOSIS — Z130 Encounter for screening for diseases of the blood and blood-forming organs and certain disorders involving the immune mechanism: Secondary | ICD-10-CM

## 2017-12-03 DIAGNOSIS — R7309 Other abnormal glucose: Secondary | ICD-10-CM | POA: Diagnosis not present

## 2017-12-03 DIAGNOSIS — G479 Sleep disorder, unspecified: Secondary | ICD-10-CM | POA: Diagnosis not present

## 2017-12-03 DIAGNOSIS — D649 Anemia, unspecified: Secondary | ICD-10-CM

## 2017-12-03 DIAGNOSIS — M1990 Unspecified osteoarthritis, unspecified site: Secondary | ICD-10-CM

## 2017-12-03 DIAGNOSIS — Z79899 Other long term (current) drug therapy: Secondary | ICD-10-CM

## 2017-12-03 HISTORY — DX: Sleep disorder, unspecified: G47.9

## 2017-12-03 LAB — LIPID PANEL
Cholesterol: 229 mg/dL — ABNORMAL HIGH (ref 0–200)
HDL: 104.2 mg/dL (ref >39.00)
LDL Cholesterol: 113 mg/dL — ABNORMAL HIGH (ref 0–99)
NonHDL: 125.1
Total CHOL/HDL Ratio: 2
Triglycerides: 63 mg/dL (ref 0.0–149.0)
VLDL: 12.6 mg/dL (ref 0.0–40.0)

## 2017-12-03 LAB — BASIC METABOLIC PANEL
BUN: 14 mg/dL (ref 6–23)
CO2: 29 mEq/L (ref 19–32)
Calcium: 10.1 mg/dL (ref 8.4–10.5)
Chloride: 105 mEq/L (ref 96–112)
Creatinine, Ser: 0.9 mg/dL (ref 0.40–1.20)
GFR: 80.23 mL/min (ref >60.00)
Glucose, Bld: 97 mg/dL (ref 70–99)
Potassium: 4.6 mEq/L (ref 3.5–5.1)
Sodium: 140 mEq/L (ref 135–145)

## 2017-12-03 LAB — CBC
HCT: 37.9 % (ref 36.0–46.0)
Hemoglobin: 12.3 g/dL (ref 12.0–15.0)
MCHC: 32.4 g/dL (ref 30.0–36.0)
MCV: 84.2 fl (ref 78.0–100.0)
Platelets: 247 10*3/uL (ref 150.0–400.0)
RBC: 4.5 Mil/uL (ref 3.87–5.11)
RDW: 14.3 % (ref 11.5–15.5)
WBC: 3.1 10*3/uL — ABNORMAL LOW (ref 4.0–10.5)

## 2017-12-03 LAB — HEMOGLOBIN A1C: Hgb A1c MFr Bld: 5.9 % (ref 4.6–6.5)

## 2017-12-03 LAB — TSH: TSH: 1.82 u[IU]/mL (ref 0.35–4.50)

## 2017-12-03 NOTE — Progress Notes (Signed)
Sheri Kelly , January 04, 1951, 67 y.o., female MRN: [id number] Patient Care Team    Relationship Specialty Notifications Start End  Ma Hillock, DO PCP - General Family Medicine  09/25/16   Inda Castle, MD (Inactive) Consulting Physician Gastroenterology  09/25/16   Everett Graff, MD Consulting Physician Obstetrics and Gynecology  09/25/16   Laurence Aly, OD  Optometry  09/25/16    Comment: [name]  Gaynelle Arabian, MD Consulting Physician Orthopedic Surgery  09/26/17     Chief Complaint  Patient presents with  . Arthritis    Left hip, hyperlipidemia     Subjective:  Overweight (BMI 25.0-29.9)/elevated glucose/anemia: Sheri Kelly is a 67 y.o. female present to discuss her chronic conditions and obtain lab work. She has had elevated total cholesterol in the past, however her HDL is rather high and LDL <130. She has had mild anemia in the past and her baseline WBC is mildly lower than lab norms. MRI reviewed 08/22/2017 of her hips, resulted with OA bilateral hips left > right. She is established with Dr. Wynelle Link. She is taking tumeric, magnesium, flaxseed, Glucosamine-chondroitin, B12, Vit d routinely. She also reports she is not sleeping as well lately. She reports she wakes up about 3-4 am , has trouble getting back to sleep, but once she does fall back asleep she sleeps until 9am   She brings an alpha-1 antitrypsin test packet today from an online source/lab and is asking if we could draw/finger prick and complete the test for her. She states she has done reading and is worried she has this deficiency because her father died of COPD. She does not have a dx of COPD. She denies shortness of breath. She did smoke until 2011.   Depression screen [facility] 2/9 09/26/2017 09/25/2016 08/02/2015  Decreased Interest 0 0 0  Down, Depressed, Hopeless 0 0 0  PHQ - 2 Score 0 0 0    Allergies  Allergen Reactions  . Dust Mite Extract   . Molds & Smuts    Social History    Tobacco Use  . Smoking status: Former Smoker    Last attempt to quit: 09/11/2009    Years since quitting: 8.2  . Smokeless tobacco: Never Used  Substance Use Topics  . Alcohol use: Yes    Alcohol/week: 5.0 standard drinks    Types: 5 Glasses of wine per week    Comment: social   Past Medical History:  Diagnosis Date  . Arthritis   . Cervical dysplasia   . Cervical polyp 01/14/2008  . Complication of anesthesia    had ileus after hysterectomy 2011  . History of breast reconstruction    Bilateral   . History of chicken pox   . History of measles, mumps, or rubella   . Infertility, female   . Menopausal symptoms   . Trigger finger of all digits of right hand    RMF  . Uterine fibroid    Past Surgical History:  Procedure Laterality Date  . ABDOMINAL HYSTERECTOMY  09/19/2009-09/22/2009   total  . BREAST SURGERY  1998   "breast lift"  . CESAREAN SECTION      x 2  . INTRAOCULAR LENS INSERTION Bilateral   . MOUTH SURGERY    . SALPINGOOPHORECTOMY  09/19/2009   right  . TRIGGER FINGER RELEASE Right 05/12/2017   Procedure: RIGHT TRIGGER FINGER RELEASE;  Surgeon: Cristine Polio, MD;  Location: [place];  Service: Plastics;  Laterality: Right;  . TUBAL  LIGATION     bilateral  . UTERINE FIBROID SURGERY     Family History  Problem Relation Age of Onset  . Arthritis Mother   . Breast cancer Mother   . Lung cancer Father   . Hypertension Brother   . Throat cancer Brother   . Leukemia Sister   . Hypertension Son    Allergies as of 12/03/2017      Reactions   Dust Mite Extract    Molds & Smuts       Medication List        Accurate as of 12/03/17  9:59 AM. Always use your most recent med list.          cholecalciferol 1000 units tablet Commonly known as:  VITAMIN D Take 1,000 Units by mouth daily.   CURCUMIN 95 PO Take by mouth.   Flax Seed Oil 1000 MG Caps Take by mouth.   fluticasone 50 MCG/ACT nasal spray Commonly known as:  FLONASE Place  into both nostrils daily.   glucosamine-chondroitin 500-400 MG tablet Take 1 tablet by mouth 3 (three) times daily.   MAG-200 PO Take by mouth.   multivitamin tablet Take 1 tablet by mouth daily.   OMEGA 3-6-9 COMPLEX PO Take 1 capsule by mouth daily.   vitamin B-12 1000 MCG tablet Commonly known as:  CYANOCOBALAMIN Take 1,000 mcg by mouth daily.       All past medical history, surgical history, allergies, family history, immunizations andmedications were updated in the EMR today and reviewed under the history and medication portions of their EMR.     ROS: Negative, with the exception of above mentioned in HPI   Objective:  BP 107/64 (BP Location: Left Arm, Patient Position: Sitting, Cuff Size: Normal)   Pulse (!) 54   Resp 16   Ht 5\' 7"  (1.702 m)   Wt 164 lb (74.4 kg)   SpO2 99%   BMI 25.69 kg/m  Body mass index is 25.69 kg/m. Gen: Afebrile. No acute distress. Nontoxic in appearance, well developed, well nourished. AAF.  HENT: AT. [employer]. MMM, no oral lesions. Throat without erythema or exudates. No cough or hoarseness.  Eyes:Pupils Equal Round Reactive to light, Extraocular movements intact,  Conjunctiva without redness, discharge or icterus. Neck/lymp/endocrine: Supple,no lymphadenopathy CV: RRR no murmur, no edema Chest: CTAB, no wheeze or crackles. Good air movement, normal resp effort.  Abd: Soft. NTND. BS present.  Neuro:  Normal gait. PERLA. EOMi. Alert. Oriented x3  Psych: Normal affect, dress and demeanor. Normal speech. Normal thought content and judgment.  No exam data present No results found. No results found for this or any previous visit (from the past 24 hour(s)).  Assessment/Plan: Sheri Kelly is a 67 y.o. female present for OV for  Overweight (BMI 25.0-29.9) - Lipid panel - continue exercise and healthy diet.  Encounter for long-term (current) use of medications/OTC herbals.  - Basic Metabolic Panel (BMET) Screening for deficiency anemia -  CBC Elevated glucose - HgB A1c Anemia, unspecified type - CBC Sleep disturbance Try melatonin gummies.  - TSH Arthritis:  she is on OTC herbal regimens and following with ortho. She did not mention this much today. Reviewed MRI from 06/2017  * I have concerns about the online testing and have asked her to let us look into the company for her to make sure it is not a scam of some type. If truly free, it should not require providers NPI number and healthcare persons to perform fingerstick.  She  has no COPD symptoms or prior dx, although she was a smoker. Her father was not know to have alpha-1 antitrypsin def as his cause of COPD. If desiring formal workup for COPD we could get CXR, then perform needed testing in house and refer to pulm if needed. She is ok with [place] looking into the company first before making recommendations. Social worker, Estill Bamberg, looking into it for her.    Reviewed expectations re: course of current medical issues.  Discussed self-management of symptoms.  Outlined signs and symptoms indicating need for more acute intervention.  Patient verbalized understanding and all questions were answered.  Patient received an After-Visit Summary.    No orders of the defined types were placed in this encounter.    Note is dictated utilizing voice recognition software. Although note has been proof read prior to signing, occasional typographical errors still can be missed. If any questions arise, please do not hesitate to call for verification.   electronically signed by:  Howard Pouch, DO  [facility]

## 2017-12-03 NOTE — Patient Instructions (Signed)
I will call you with labs once received.   Please help [place] help you:  We are honored you have chosen [facility] for your Primary Care home. Below you will find basic instructions that you may need to access in the future. Please help [place] help you by reading the instructions, which cover many of the frequent questions we experience.   Prescription refills and request:  -In order to allow more efficient response time, please call your pharmacy for all refills. They will forward the request electronically to [place]. This allows for the quickest possible response. Request left on a nurse line can take longer to refill, since these are checked as time allows between office patients and other phone calls.  - refill request can take up to 3-5 working days to complete.  - If request is sent electronically and request is appropiate, it is usually completed in 1-2 business days.  - all patients will need to be seen routinely for all chronic medical conditions requiring prescription medications (see follow-up below). If you are overdue for follow up on your condition, you will be asked to make an appointment and we will call in enough medication to cover you until your appointment (up to 30 days).  - all controlled substances will require a face to face visit to request/refill.  - if you desire your prescriptions to go through a new pharmacy, and have an active script at original pharmacy, you will need to call your pharmacy and have scripts transferred to new pharmacy. This is completed between the pharmacy locations and not by your provider.    Results: If any images or labs were ordered, it can take up to 1 week to get results depending on the test ordered and the lab/facility running and resulting the test. - Normal or stable results, which do not need further discussion, may be released to your mychart immediately with attached note to you. A call may not be generated for normal results. Please make  certain to sign up for mychart. If you have questions on how to activate your mychart you can call the front office.  - If your results need further discussion, our office will attempt to contact you via phone, and if unable to reach you after 2 attempts, we will release your abnormal result to your mychart with instructions.  - All results will be automatically released in mychart after 1 week.  - Your provider will provide you with explanation and instruction on all relevant material in your results. Please keep in mind, results and labs may appear confusing or abnormal to the untrained eye, but it does not mean they are actually abnormal for you personally. If you have any questions about your results that are not covered, or you desire more detailed explanation than what was provided, you should make an appointment with your provider to do so.   Our office handles many outgoing and incoming calls daily. If we have not contacted you within 1 week about your results, please check your mychart to see if there is a message first and if not, then contact our office.  In helping with this matter, you help decrease call volume, and therefore allow [place] to be able to respond to patients needs more efficiently.   Acute office visits (sick visit):  An acute visit is intended for a new problem and are scheduled in shorter time slots to allow schedule openings for patients with new problems. This is the appropriate visit to  discuss a new problem. Problems will not be addressed by phone call or Echart message. Appointment is needed if requesting treatment. In order to provide you with excellent quality medical care with proper time for you to explain your problem, have an exam and receive treatment with instructions, these appointments should be limited to one new problem per visit. If you experience a new problem, in which you desire to be addressed, please make an acute office visit, we save openings on the schedule  to accommodate you. Please do not save your new problem for any other type of visit, let us take care of it properly and quickly for you.   Follow up visits:  Depending on your condition(s) your provider will need to see you routinely in order to provide you with quality care and prescribe medication(s). Most chronic conditions (Example: hypertension, Diabetes, depression/anxiety... etc), require visits a couple times a year. Your provider will instruct you on proper follow up for your personal medical conditions and history. Please make certain to make follow up appointments for your condition as instructed. Failing to do so could result in lapse in your medication treatment/refills. If you request a refill, and are overdue to be seen on a condition, we will always provide you with a 30 day script (once) to allow you time to schedule.    Medicare wellness (well visit): - we have a wonderful Nurse Maudie Mercury), that will meet with you and provide you will yearly medicare wellness visits. These visits should occur yearly (can not be scheduled less than 1 calendar year apart) and cover preventive health, immunizations, advance directives and screenings you are entitled to yearly through your medicare benefits. Do not miss out on your entitled benefits, this is when medicare will pay for these benefits to be ordered for you.  These are strongly encouraged by your provider and is the appropriate type of visit to make certain you are up to date with all preventive health benefits. If you have not had your medicare wellness exam in the last 12 months, please make certain to schedule one by calling the office and schedule your medicare wellness with Maudie Mercury as soon as possible.   Yearly physical (well visit):  - Adults are recommended to be seen yearly for physicals. Check with your insurance and date of your last physical, most insurances require one calendar year between physicals. Physicals include all preventive health  topics, screenings, medical exam and labs that are appropriate for gender/age and history. You may have fasting labs needed at this visit. This is a well visit (not a sick visit), new problems should not be covered during this visit (see acute visit).  - Pediatric patients are seen more frequently when they are younger. Your provider will advise you on well child visit timing that is appropriate for your their age. - This is not a medicare wellness visit. Medicare wellness exams do not have an exam portion to the visit. Some medicare companies allow for a physical, some do not allow a yearly physical. If your medicare allows a yearly physical you can schedule the medicare wellness with our nurse Maudie Mercury and have your physical with your provider after, on the same day. Please check with insurance for your full benefits.   Late Policy/No Shows:  - all new patients should arrive 15-30 minutes earlier than appointment to allow [place] time  to  obtain all personal demographics,  insurance information and for you to complete office paperwork. - All established patients should  arrive 10-15 minutes earlier than appointment time to update all information and be checked in .  - In our best efforts to run on time, if you are late for your appointment you will be asked to either reschedule or if able, we will work you back into the schedule. There will be a wait time to work you back in the schedule,  depending on availability.  - If you are unable to make it to your appointment as scheduled, please call 24 hours ahead of time to allow [place] to fill the time slot with someone else who needs to be seen. If you do not cancel your appointment ahead of time, you may be charged a no show fee.

## 2017-12-04 ENCOUNTER — Encounter: Payer: Self-pay | Admitting: Family Medicine

## 2017-12-25 DIAGNOSIS — M25552 Pain in left hip: Secondary | ICD-10-CM | POA: Diagnosis not present

## 2017-12-25 DIAGNOSIS — M2550 Pain in unspecified joint: Secondary | ICD-10-CM | POA: Diagnosis not present

## 2017-12-25 DIAGNOSIS — M150 Primary generalized (osteo)arthritis: Secondary | ICD-10-CM | POA: Diagnosis not present

## 2017-12-25 DIAGNOSIS — Z6825 Body mass index (BMI) 25.0-25.9, adult: Secondary | ICD-10-CM | POA: Diagnosis not present

## 2017-12-25 DIAGNOSIS — E663 Overweight: Secondary | ICD-10-CM | POA: Diagnosis not present

## 2018-02-18 ENCOUNTER — Other Ambulatory Visit: Payer: Self-pay | Admitting: Family Medicine

## 2018-02-18 DIAGNOSIS — Z1231 Encounter for screening mammogram for malignant neoplasm of breast: Secondary | ICD-10-CM

## 2018-02-24 DIAGNOSIS — Z23 Encounter for immunization: Secondary | ICD-10-CM | POA: Diagnosis not present

## 2018-04-02 ENCOUNTER — Ambulatory Visit
Admission: RE | Admit: 2018-04-02 | Discharge: 2018-04-02 | Disposition: A | Discharge status: Home / Self Care | Payer: Medicare Other | Source: Ambulatory Visit | Attending: Family Medicine | Admitting: Family Medicine

## 2018-04-02 DIAGNOSIS — Z1231 Encounter for screening mammogram for malignant neoplasm of breast: Secondary | ICD-10-CM | POA: Diagnosis not present

## 2018-10-02 ENCOUNTER — Ambulatory Visit (INDEPENDENT_AMBULATORY_CARE_PROVIDER_SITE_OTHER): Payer: Medicare Other | Admitting: Family Medicine

## 2018-10-02 ENCOUNTER — Ambulatory Visit: Payer: Medicare Other

## 2018-10-02 ENCOUNTER — Encounter: Payer: Self-pay | Admitting: Family Medicine

## 2018-10-02 ENCOUNTER — Other Ambulatory Visit: Payer: Self-pay

## 2018-10-02 VITALS — BP 99/62 | HR 59 | Temp 97.5°F | Resp 17 | Ht 67.75 in | Wt 168.1 lb

## 2018-10-02 DIAGNOSIS — Z79899 Other long term (current) drug therapy: Secondary | ICD-10-CM | POA: Diagnosis not present

## 2018-10-02 DIAGNOSIS — Z23 Encounter for immunization: Secondary | ICD-10-CM

## 2018-10-02 DIAGNOSIS — Z0000 Encounter for general adult medical examination without abnormal findings: Secondary | ICD-10-CM | POA: Diagnosis not present

## 2018-10-02 DIAGNOSIS — R7309 Other abnormal glucose: Secondary | ICD-10-CM | POA: Diagnosis not present

## 2018-10-02 DIAGNOSIS — Z862 Personal history of diseases of the blood and blood-forming organs and certain disorders involving the immune mechanism: Secondary | ICD-10-CM | POA: Diagnosis not present

## 2018-10-02 DIAGNOSIS — Z1231 Encounter for screening mammogram for malignant neoplasm of breast: Secondary | ICD-10-CM

## 2018-10-02 LAB — CBC
HCT: 35.2 % — ABNORMAL LOW (ref 36.0–46.0)
Hemoglobin: 11.7 g/dL — ABNORMAL LOW (ref 12.0–15.0)
MCHC: 33.2 g/dL (ref 30.0–36.0)
MCV: 84.1 fl (ref 78.0–100.0)
Platelets: 258 10*3/uL (ref 150.0–400.0)
RBC: 4.19 Mil/uL (ref 3.87–5.11)
RDW: 14.1 % (ref 11.5–15.5)
WBC: 3.6 10*3/uL — ABNORMAL LOW (ref 4.0–10.5)

## 2018-10-02 LAB — BASIC METABOLIC PANEL
BUN: 18 mg/dL (ref 6–23)
CO2: 29 mEq/L (ref 19–32)
Calcium: 10.1 mg/dL (ref 8.4–10.5)
Chloride: 104 mEq/L (ref 96–112)
Creatinine, Ser: 0.78 mg/dL (ref 0.40–1.20)
GFR: 88.82 mL/min (ref >60.00)
Glucose, Bld: 72 mg/dL (ref 70–99)
Potassium: 4.8 mEq/L (ref 3.5–5.1)
Sodium: 141 mEq/L (ref 135–145)

## 2018-10-02 LAB — HEMOGLOBIN A1C: Hgb A1c MFr Bld: 6 % (ref 4.6–6.5)

## 2018-10-02 MED ORDER — ZOSTER VAC RECOMB ADJUVANTED 50 MCG/0.5ML IM SUSR: 0.5000 mL | Freq: Once | INTRAMUSCULAR | 1 refills | Status: AC

## 2018-10-02 NOTE — Progress Notes (Signed)
Medicare AWV, subsequent Chief Complaint  Patient presents with  . Medicare Wellness    Not fasting. No complaints. Pap- N/A  Mammogram 03/2018. Living will and POA- requested copy. Pt is due for eye exam    Patient Care Team    Relationship Specialty Notifications Start End  Ma Hillock, DO PCP - General Family Medicine  09/25/16   Inda Castle, MD (Inactive) Consulting Physician Gastroenterology  09/25/16   Everett Graff, MD Consulting Physician Obstetrics and Gynecology  09/25/16   Laurence Aly, OD  Optometry  09/25/16    Comment: [name]  Gaynelle Arabian, MD Consulting Physician Orthopedic Surgery  09/26/17      History of Present Ilness: [place], 68 y.o. , female presents today for Medicare wellness visit.  Colonoscopy: no fhx, Dr. Deatra Ina, 09/18/2010, no polyps, diverticulosis present. Follow up in 10 years.  Mammogram (50-74): Fhx present in mother, mammogram 03/2018, normal.  Cervical cancer screening(<65): Pt has a hystrectomy and follows with GYN.  Immunizations: tdap 09/2016.  Prevnar completed 09/2016- pneumovax completed today,  Flu shot encouraged yearly. Shingrix printed  Infectious disease screening: Hep C completed.  Glaucoma screen: has completed yearly, [facility], results normal Hearing: Whisper test, no barriers identified. No barriers  Cognitive assessment:  Word recall (daisy, blue, church): 3/3  Past medical, surgical, family and social histories reviewed (including experiences with illnesses, hospital stays, operations, injuries, and treatments):  Past Medical History:  Diagnosis Date  . Arthritis   . Cervical dysplasia   . Cervical polyp 01/14/2008  . Complication of anesthesia    had ileus after hysterectomy 2011  . History of breast reconstruction    Bilateral   . History of chicken pox   . History of measles, mumps, or rubella   . Infertility, female   . Menopausal symptoms   . Trigger  finger of all digits of right hand    RMF  . Uterine fibroid    All allergies reviewed Allergies  Allergen Reactions  . Dust Mite Extract   . Molds & Smuts    Past Surgical History:  Procedure Laterality Date  . ABDOMINAL HYSTERECTOMY  09/19/2009-09/22/2009   total  . BREAST SURGERY  1998   "breast lift"  . CESAREAN SECTION      x 2  . INTRAOCULAR LENS INSERTION Bilateral   . MOUTH SURGERY    . SALPINGOOPHORECTOMY  09/19/2009   right  . TRIGGER FINGER RELEASE Right 05/12/2017   Procedure: RIGHT TRIGGER FINGER RELEASE;  Surgeon: Cristine Polio, MD;  Location: [facility];  Service: Plastics;  Laterality: Right;  . TUBAL LIGATION     bilateral  . UTERINE FIBROID SURGERY     Family History  Problem Relation Age of Onset  . Arthritis Mother   . Breast cancer Mother   . Lung cancer Father   . Hypertension Brother   . Throat cancer Brother   . Leukemia Sister   . Hypertension Son    Social History   Social History Narrative   Divorced. 1 child ([name]).   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    Drinks caffeine, uses herbal remedies. Takes a daily vitamin.    Wears seatbelt, bicycle helmet and dentures.    Smoke detector in the home. Firearms locked in the home.    Feels safe in her relationships.     All medications verified Allergies as of 10/02/2018  Reactions   Dust Mite Extract    Molds & Smuts       Medication List       Accurate as of October 02, 2018  1:23 PM. If you have any questions, ask your nurse or doctor.        Biotin 10 MG Caps Take 1 tablet by mouth daily.   cholecalciferol 1000 units tablet Commonly known as: VITAMIN D Take 1,000 Units by mouth daily.   CURCUMIN 95 PO Take by mouth.   Flax Seed Oil 1000 MG Caps Take by mouth.   fluticasone 50 MCG/ACT nasal spray Commonly known as: FLONASE Place into both nostrils daily.   glucosamine-chondroitin 500-400 MG tablet Take 1 tablet by mouth 3 (three) times daily.   MAG-200  PO Take by mouth.   multivitamin tablet Take 1 tablet by mouth daily.   OMEGA 3-6-9 COMPLEX PO Take 1 capsule by mouth daily.   vitamin B-12 1000 MCG tablet Commonly known as: CYANOCOBALAMIN Take 1,000 mcg by mouth daily.      Depression screen [facility] 2/9 10/02/2018 12/03/2017 09/26/2017 09/25/2016 08/02/2015  Decreased Interest 0 0 0 0 0  Down, Depressed, Hopeless 0 - 0 0 0  PHQ - 2 Score 0 0 0 0 0   No flowsheet data found.  Immunizations: Immunization History  Administered Date(s) Administered  . Influenza, High Dose Seasonal PF 02/24/2018  . Influenza,inj,quad, With Preservative 11/13/2016  . Pneumococcal Conjugate-13 09/25/2016    Exercise: Current Exercise Habits: The patient has a physically strenuous job, but has no regular exercise apart from work. Exercise limited by: None identified  Diet: Regular  Functional Status Survey: Get up and go test: steady and less than 20 seconds.  Is the patient deaf or have difficulty hearing?: No Does the patient have difficulty seeing, even when wearing glasses/contacts?: No Does the patient have difficulty concentrating, remembering, or making decisions?: No Does the patient have difficulty walking or climbing stairs?: No Does the patient have difficulty dressing or bathing?: No Does the patient have difficulty doing errands alone such as visiting a doctor's office or shopping?: No Current Exercise Habits: The patient has a physically strenuous job, but has no regular exercise apart from work. Exercise limited by: None identified Fall Risk  10/02/2018 12/03/2017 09/26/2017 09/25/2016 08/02/2015  Falls in the past year? 0 No No No No  Follow up Education provided;Falls evaluation completed;Falls prevention discussed - - - -    Advanced Care Planning: has advanced care planning and will bring in for copy.   Cardiovascular Screening Blood Tests Lipid Panel     Component Value Date/Time   CHOL 229 (H) 12/03/2017 1028   TRIG 63.0  12/03/2017 1028   HDL 104.20 12/03/2017 1028   CHOLHDL 2 12/03/2017 1028   VLDL 12.6 12/03/2017 1028   LDLCALC 113 (H) 12/03/2017 1028    Diabetes Screening Tests Lab Results  Component Value Date   HGBA1C 5.9 12/03/2017    Alcohol abuse screening: completed if indicated STIs screening: Completed if indicated  Assessment and Plan: Medicare annual wellness visit, subsequent Patient was encouraged to exercise greater than 150 minutes a week. Patient was encouraged to choose a diet filled with fresh fruits and vegetables, and lean meats. AVS provided to patient today for education/recommendation on gender specific health and safety maintenance. Pneumovax update today- completed series  shingrix script provided.  Colonoscopy: due 2020.  Mammogram (50-74):Due 03/2019  Immunizations: tdap 09/2016.  Prevnar completed 09/2016- pneumovax completed today,  Flu shot  encouraged yearly. Shingrix printed  Infectious disease screening: Hep C completed.  Glaucoma screen: she needs to schedule for 2020 Elevated glucose - Hemoglobin A1c Encounter for long-term current use of medication - Basic Metabolic Panel (BMET) History of anemia - CBC Breast cancer screening by mammogram - MM 3D SCREEN BREAST BILATERAL; Future  Return in about 1 year (around 10/02/2019) for Medicare Wellness (53m).    Electronically Signed by: Howard Pouch, DO [place]

## 2018-10-02 NOTE — Patient Instructions (Addendum)
Goals    . Exercise 150 min/wk Moderate Activity     Increase exercise.       Your health maintenance is all up to date.  They will call you to schedule your mammogram in December   Health Maintenance After Age 68 After age 14, you are at a higher risk for certain long-term diseases and infections as well as injuries from falls. Falls are a major cause of broken bones and head injuries in people who are older than age 64. Getting regular preventive care can help to keep you healthy and well. Preventive care includes getting regular testing and making lifestyle changes as recommended by your health care provider. Talk with your health care provider about:  Which screenings and tests you should have. A screening is a test that checks for a disease when you have no symptoms.  A diet and exercise plan that is right for you. What should I know about screenings and tests to prevent falls? Screening and testing are the best ways to find a health problem early. Early diagnosis and treatment give you the best chance of managing medical conditions that are common after age 65. Certain conditions and lifestyle choices may make you more likely to have a fall. Your health care provider may recommend:  Regular vision checks. Poor vision and conditions such as cataracts can make you more likely to have a fall. If you wear glasses, make sure to get your prescription updated if your vision changes.  Medicine review. Work with your health care provider to regularly review all of the medicines you are taking, including over-the-counter medicines. Ask your health care provider about any side effects that may make you more likely to have a fall. Tell your health care provider if any medicines that you take make you feel dizzy or sleepy.  Osteoporosis screening. Osteoporosis is a condition that causes the bones to get weaker. This can make the bones weak and cause them to break more easily.  Blood pressure  screening. Blood pressure changes and medicines to control blood pressure can make you feel dizzy.  Strength and balance checks. Your health care provider may recommend certain tests to check your strength and balance while standing, walking, or changing positions.  Foot health exam. Foot pain and numbness, as well as not wearing proper footwear, can make you more likely to have a fall.  Depression screening. You may be more likely to have a fall if you have a fear of falling, feel emotionally low, or feel unable to do activities that you used to do.  Alcohol use screening. Using too much alcohol can affect your balance and may make you more likely to have a fall. What actions can I take to lower my risk of falls? General instructions  Talk with your health care provider about your risks for falling. Tell your health care provider if: ? You fall. Be sure to tell your health care provider about all falls, even ones that seem minor. ? You feel dizzy, sleepy, or off-balance.  Take over-the-counter and prescription medicines only as told by your health care provider. These include any supplements.  Eat a healthy diet and maintain a healthy weight. A healthy diet includes low-fat dairy products, low-fat (lean) meats, and fiber from whole grains, beans, and lots of fruits and vegetables. Home safety  Remove any tripping hazards, such as rugs, cords, and clutter.  Install safety equipment such as grab bars in bathrooms and safety rails on stairs.  Keep rooms and walkways well-lit. Activity   Follow a regular exercise program to stay fit. This will help you maintain your balance. Ask your health care provider what types of exercise are appropriate for you.  If you need a cane or walker, use it as recommended by your health care provider.  Wear supportive shoes that have nonskid soles. Lifestyle  Do not drink alcohol if your health care provider tells you not to drink.  If you drink  alcohol, limit how much you have: ? 0-1 drink a day for women. ? 0-2 drinks a day for men.  Be aware of how much alcohol is in your drink. In the [place] one drink equals one typical bottle of beer (12 oz), one-half glass of wine (5 oz), or one shot of hard liquor (1 oz).  Do not use any products that contain nicotine or tobacco, such as cigarettes and e-cigarettes. If you need help quitting, ask your health care provider. Summary  Having a healthy lifestyle and getting preventive care can help to protect your health and wellness after age 71.  Screening and testing are the best way to find a health problem early and help you avoid having a fall. Early diagnosis and treatment give you the best chance for managing medical conditions that are more common for people who are older than age 57.  Falls are a major cause of broken bones and head injuries in people who are older than age 56. Take precautions to prevent a fall at home.  Work with your health care provider to learn what changes you can make to improve your health and wellness and to prevent falls. This information is not intended to replace advice given to you by your health care provider. Make sure you discuss any questions you have with your health care provider. Document Released: 02/12/2017 Document Revised: 02/12/2017 Document Reviewed: 02/12/2017 Elsevier Interactive Patient Education  2019 [employer].

## 2018-10-05 ENCOUNTER — Telehealth: Payer: Self-pay | Admitting: Family Medicine

## 2018-10-05 NOTE — Telephone Encounter (Signed)
Patient dropped off [employer] gift card form to be completed.

## 2018-10-06 ENCOUNTER — Telehealth: Payer: Self-pay | Admitting: Family Medicine

## 2018-10-06 ENCOUNTER — Other Ambulatory Visit: Payer: Medicare Other

## 2018-10-06 DIAGNOSIS — D649 Anemia, unspecified: Secondary | ICD-10-CM

## 2018-10-06 NOTE — Telephone Encounter (Signed)
Tried to call patient and VM was full with no answer. Lattie Haw was able to add on the lab that Dr Raoul Pitch requested, therefore no lab appt is needed. Once FOBT is returned pt needs to call  schedule F/U appt to discuss results.

## 2018-10-06 NOTE — Telephone Encounter (Signed)
Forms were filled out and mailed to patient. Copy made to scan into chart.

## 2018-10-06 NOTE — Telephone Encounter (Signed)
Please inform patient the following information: - Her labs indicated mild anemia. Please send her FOBT cards to complete and explain how to return them.  These will be positive if blood loss through colon.  - her a1c/ Dm screen was 6.0. This is slowly creeping up, it was 5.9 last year. Her glucose is normal, so no medication s needed at this time, I would recommend a mediterranean diet and regular exercise.  A mediterranean diet is high in fruits, vegetables, whole grains, fish, chicken, nuts, healthy fats (olive oil or canola oil). Low fat dairy. There are many online resources and books on this diet. Limit butter, margarine, red meat and sweets.   I also want her to have an iron panel completed by lab appt only. Provider f/u 1 week after panel to discuss FOBT findings and iron findings.

## 2018-10-07 LAB — IRON,TIBC AND FERRITIN PANEL
%SAT: 44 % (calc) (ref 16–45)
Ferritin: 110 ng/mL (ref 16–288)
Iron: 164 ug/dL — ABNORMAL HIGH (ref 45–160)
TIBC: 375 mcg/dL (calc) (ref 250–450)

## 2018-10-07 NOTE — Telephone Encounter (Signed)
Pt was called and given lab results, she verbalized understanding. She will come and pick up stool cards, this was placed up front. Labs were able to be added from previous drawn blood. Pt will call to schedule visit once stool test has been complete

## 2018-10-08 ENCOUNTER — Telehealth: Payer: Self-pay | Admitting: Family Medicine

## 2018-10-08 NOTE — Telephone Encounter (Signed)
Pt was called and she is taking a prenatal vit that does have iron in it. Pt stated she did not want to take it anymore anyway and she would stop the vitamin. She has not picked up stool cards, but will today, these were placed up front already. Pt verbalized understanding on plan.

## 2018-10-08 NOTE — Telephone Encounter (Signed)
Please inform patient the following information: Still waiting her fobt cards- we will call her when receive results.  Her iron is actually higher than normal range (45-160 nl; hers is 164). The rest of the panel is in normal ranges. The pattern to her panel appears consistent with mild  iron overload- if she is taking any iron supplement- I would I advise her to decrease her dose by  1/2.  Is she taking any extra iron?   Next steps will depend if she is taking iron or not and her FOBT cards.

## 2018-11-10 ENCOUNTER — Telehealth: Payer: Self-pay | Admitting: *Deleted

## 2018-11-10 NOTE — Telephone Encounter (Signed)
It does not look like she needs colon cancer screening until June 2022.  That is based on no polyps 2012 and no family history of colon cancer.  Also she has very slight anemia recently but not iron deficient based on June 2020 labs.  She should be in recall system for repeat colonoscopy June 2022.  If she has any questions or concerns then she should be set up for new GI office visit with first available provider to discuss and answer questions.  Thank you

## 2018-11-10 NOTE — Telephone Encounter (Signed)
Dr Ardis Hughs,  This pt had a colonoscopy with Sheri Kelly 09-18-2010- normal, tics- 10 yr recall- she is due 2022 per recall-  Pt stated her insurance has approved 3 yr or 10 yr per the appointment notes -but her recall is 10 years. She insisted on being scheduled per the notes     Do you want her to come now, or 2022?  Please advise, Thanks Lelan Pons

## 2018-11-10 NOTE — Telephone Encounter (Signed)
Spoke with pt about Dr Ardis Hughs recommendations-  She wants to schedule an OV to discuss constipation- she thinks she needs to have a colon sooner than 2022 - she has no rectal bleeding, no 1st degree relative with colon cancer- she told me she has a cousin that had a colonoscopy that was normal then later developed colon cancer and she is concerned - OV scheduled for 8-17 at  2 pm with Amy per Dr  Ardis Hughs note --  Pt aware she needs to arrive at 145 pm to register - I canceled her PV and her colon as scheduled

## 2018-11-10 NOTE — Telephone Encounter (Signed)
Attempted to call pt- no answer- Mail box is full and cannot leave a message at this time, Will try back later today

## 2018-11-18 ENCOUNTER — Other Ambulatory Visit: Payer: Medicare Other

## 2018-11-18 ENCOUNTER — Telehealth: Payer: Self-pay

## 2018-11-18 DIAGNOSIS — D649 Anemia, unspecified: Secondary | ICD-10-CM

## 2018-11-18 LAB — HEMOCCULT SLIDES (X 3 CARDS)
Fecal Occult Blood: NEGATIVE
OCCULT 1: NEGATIVE
OCCULT 2: NEGATIVE
OCCULT 3: NEGATIVE
OCCULT 4: NEGATIVE
OCCULT 5: NEGATIVE

## 2018-11-18 NOTE — Telephone Encounter (Signed)
[facility] called and needed the correct hemoccult lab placed. Ordered and placed as future lab

## 2018-11-18 NOTE — Addendum Note (Signed)
Addended by: Caroll Rancher L on: 11/18/2018 02:23 PM   Modules accepted: Orders

## 2018-11-30 ENCOUNTER — Ambulatory Visit: Payer: Medicare Other | Admitting: Physician Assistant

## 2018-12-04 ENCOUNTER — Encounter: Payer: Medicare Other | Admitting: Gastroenterology

## 2019-07-01 ENCOUNTER — Encounter: Payer: Self-pay | Admitting: Family Medicine

## 2019-07-01 ENCOUNTER — Ambulatory Visit (INDEPENDENT_AMBULATORY_CARE_PROVIDER_SITE_OTHER): Payer: Medicare Other | Admitting: Family Medicine

## 2019-07-01 ENCOUNTER — Other Ambulatory Visit: Payer: Self-pay

## 2019-07-01 VITALS — BP 106/68 | HR 57 | Temp 97.3°F | Resp 17 | Ht 68.0 in | Wt 171.1 lb

## 2019-07-01 DIAGNOSIS — D649 Anemia, unspecified: Secondary | ICD-10-CM

## 2019-07-01 DIAGNOSIS — Z1389 Encounter for screening for other disorder: Secondary | ICD-10-CM

## 2019-07-01 DIAGNOSIS — E663 Overweight: Secondary | ICD-10-CM

## 2019-07-01 DIAGNOSIS — Z803 Family history of malignant neoplasm of breast: Secondary | ICD-10-CM

## 2019-07-01 DIAGNOSIS — Z801 Family history of malignant neoplasm of trachea, bronchus and lung: Secondary | ICD-10-CM

## 2019-07-01 DIAGNOSIS — Z0000 Encounter for general adult medical examination without abnormal findings: Secondary | ICD-10-CM | POA: Diagnosis not present

## 2019-07-01 DIAGNOSIS — Z800 Family history of malignant neoplasm of digestive organs: Secondary | ICD-10-CM

## 2019-07-01 DIAGNOSIS — Z806 Family history of leukemia: Secondary | ICD-10-CM

## 2019-07-01 DIAGNOSIS — R7309 Other abnormal glucose: Secondary | ICD-10-CM

## 2019-07-01 HISTORY — DX: Anemia, unspecified: D64.9

## 2019-07-01 LAB — CBC WITH DIFFERENTIAL/PLATELET
Basophils Absolute: 0 10*3/uL (ref 0.0–0.1)
Basophils Relative: 0.5 % (ref 0.0–3.0)
Eosinophils Absolute: 0 10*3/uL (ref 0.0–0.7)
Eosinophils Relative: 1 % (ref 0.0–5.0)
HCT: 37.3 % (ref 36.0–46.0)
Hemoglobin: 12.3 g/dL (ref 12.0–15.0)
Lymphocytes Relative: 32.6 % (ref 12.0–46.0)
Lymphs Abs: 1 10*3/uL (ref 0.7–4.0)
MCHC: 32.9 g/dL (ref 30.0–36.0)
MCV: 85 fl (ref 78.0–100.0)
Monocytes Absolute: 0.3 10*3/uL (ref 0.1–1.0)
Monocytes Relative: 8.9 % (ref 3.0–12.0)
Neutro Abs: 1.8 10*3/uL (ref 1.4–7.7)
Neutrophils Relative %: 57 % (ref 43.0–77.0)
Platelets: 249 10*3/uL (ref 150.0–400.0)
RBC: 4.39 Mil/uL (ref 3.87–5.11)
RDW: 14.4 % (ref 11.5–15.5)
WBC: 3.2 10*3/uL — ABNORMAL LOW (ref 4.0–10.5)

## 2019-07-01 LAB — LIPID PANEL
Cholesterol: 279 mg/dL — ABNORMAL HIGH (ref 0–200)
HDL: 126.2 mg/dL (ref >39.00)
LDL Cholesterol: 142 mg/dL — ABNORMAL HIGH (ref 0–99)
NonHDL: 152.46
Total CHOL/HDL Ratio: 2
Triglycerides: 52 mg/dL (ref 0.0–149.0)
VLDL: 10.4 mg/dL (ref 0.0–40.0)

## 2019-07-01 LAB — HEMOGLOBIN A1C: Hgb A1c MFr Bld: 5.7 % (ref 4.6–6.5)

## 2019-07-01 MED ORDER — ZOSTER VAC RECOMB ADJUVANTED 50 MCG/0.5ML IM SUSR: 0.5000 mL | Freq: Once | INTRAMUSCULAR | 0 refills | Status: AC

## 2019-07-01 NOTE — Progress Notes (Signed)
This visit occurred during the SARS-CoV-2 public health emergency.  Safety protocols were in place, including screening questions prior to the visit, additional usage of staff PPE, and extensive cleaning of exam room while observing appropriate contact time as indicated for disinfecting solutions.    Patient ID: Sheri Kelly, female  DOB: Jul 09, 1950, 69 y.o.   MRN: [id number] Patient Care Team    Relationship Specialty Notifications Start End  Ma Hillock, DO PCP - General Family Medicine  09/25/16   Inda Castle, MD (Inactive) Consulting Physician Gastroenterology  09/25/16   Everett Graff, MD Consulting Physician Obstetrics and Gynecology  09/25/16   Laurence Aly, OD  Optometry  09/25/16    Comment: [name]  Gaynelle Arabian, MD Consulting Physician Orthopedic Surgery  09/26/17     Chief Complaint  Patient presents with  . Screenings    Pt would like to discuss different cancer screenings.     Subjective:  Sheri Kelly is a 69 y.o.  Female  present for CPE. All past medical history, surgical history, allergies, family history, immunizations, medications and social history were updated in the electronic medical record today. All recent labs, ED visits and hospitalizations within the last year were reviewed.  Patient reports today she wished there was one test that would tell if he had cancer or not.  She states she is worried she is going to have cancer since there are many family members who have developed cancer.  Her mother had a history of breast cancer, and patient is receiving her mammograms and has her scheduled next month.  Father had lung cancer.  Her sister had leukemia. Her brother had throat cancer.  Patient had smoked for a few years quit in 2011.  She states she really would not call herself a smoker.  She only smoked  on a rare occasion socially.  She denies any urinary changes or bowel habit changes.  She would like to have her urine tested today.   She has no complaints today.  Health maintenance:  Colonoscopy:no fhx, Dr. Deatra Ina, 09/18/2010, no polyps, diverticulosis present. Follow up in 10 years. Mammogram (50-74):Fhx present in mother, mammogram 03/2018, normal.> she has scheduled for next month Cervical cancer screening(<65):Pt has a hystrectomy and follows with GYN. Immunizations:tdap 09/2016. PNA series completed.  Flu shot UTD 2020 and encouraged yearly. Shingrix printed again  Today she forgot about it. Covid vaccines completed.  Infectious disease screening:Hep C completed Assistive device: none Oxygen [id number] Patient has a Dental home. Hospitalizations/ED visits: reviewed  Depression screen [facility] 2/9 10/02/2018 12/03/2017 09/26/2017 09/25/2016 08/02/2015  Decreased Interest 0 0 0 0 0  Down, Depressed, Hopeless 0 - 0 0 0  PHQ - 2 Score 0 0 0 0 0   No flowsheet data found.   Immunization History  Administered Date(s) Administered  . Influenza, High Dose Seasonal PF 02/24/2018  . Influenza,inj,quad, With Preservative 11/13/2016  . Influenza-Unspecified 12/15/2018  . PFIZER SARS-COV-2 Vaccination 05/07/2019, 05/28/2019  . Pneumococcal Conjugate-13 09/25/2016  . Pneumococcal Polysaccharide-23 10/02/2018   Past Medical History:  Diagnosis Date  . Arthritis   . Cervical dysplasia   . Cervical polyp 01/14/2008  . Complication of anesthesia    had ileus after hysterectomy 2011  . History of breast reconstruction    Bilateral   . History of chicken pox   . History of measles, mumps, or rubella   . Infertility, female   . Menopausal symptoms   . Trigger finger of all digits of  right hand    RMF  . Uterine fibroid    Allergies  Allergen Reactions  . Dust Mite Extract   . Molds & Smuts    Past Surgical History:  Procedure Laterality Date  . ABDOMINAL HYSTERECTOMY  09/19/2009-09/22/2009   total  . BREAST SURGERY  1998   "breast lift"  . CESAREAN SECTION      x 2  . INTRAOCULAR LENS INSERTION Bilateral   .  MOUTH SURGERY    . SALPINGOOPHORECTOMY  09/19/2009   right  . TRIGGER FINGER RELEASE Right 05/12/2017   Procedure: RIGHT TRIGGER FINGER RELEASE;  Surgeon: Cristine Polio, MD;  Location: [facility];  Service: Plastics;  Laterality: Right;  . TUBAL LIGATION     bilateral  . UTERINE FIBROID SURGERY     Family History  Problem Relation Age of Onset  . Arthritis Mother   . Breast cancer Mother   . Lung cancer Father   . Hypertension Brother   . Throat cancer Brother   . Leukemia Sister   . Hypertension Son    Social History   Social History Narrative   Divorced. 1 child ([name]).   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    Drinks caffeine, uses herbal remedies. Takes a daily vitamin.    Wears seatbelt, bicycle helmet and dentures.    Smoke detector in the home. Firearms locked in the home.    Feels safe in her relationships.     Allergies as of 07/01/2019      Reactions   Dust Mite Extract    Molds & Smuts       Medication List       Accurate as of July 01, 2019 12:05 PM. If you have any questions, ask your nurse or doctor.        STOP taking these medications   MAG-200 PO Stopped by: Howard Pouch, DO   vitamin B-12 1000 MCG tablet Commonly known as: CYANOCOBALAMIN Stopped by: Howard Pouch, DO     TAKE these medications   Biotin 10 MG Caps Take 1 tablet by mouth daily.   cholecalciferol 1000 units tablet Commonly known as: VITAMIN D Take 1,000 Units by mouth daily.   CURCUMIN 95 PO Take by mouth.   Flax Seed Oil 1000 MG Caps Take by mouth.   fluticasone 50 MCG/ACT nasal spray Commonly known as: FLONASE Place into both nostrils daily.   glucosamine-chondroitin 500-400 MG tablet Take 1 tablet by mouth 3 (three) times daily.   multivitamin tablet Take 1 tablet by mouth daily.   OMEGA 3-6-9 COMPLEX PO Take 1 capsule by mouth daily.   Zoster Vaccine Adjuvanted injection Commonly known as: SHINGRIX Inject 0.5 mLs into the muscle once for 1  dose. Started by: Howard Pouch, DO       All past medical history, surgical history, allergies, family history, immunizations andmedications were updated in the EMR today and reviewed under the history and medication portions of their EMR.     No results found for this or any previous visit (from the past 2160 hour(s)).  ROS: 14 pt review of systems performed and negative (unless mentioned in an HPI)  Objective: BP 106/68 (BP Location: Left Arm, Patient Position: Sitting, Cuff Size: Normal)   Pulse (!) 57   Temp (!) 97.3 F (36.3 C) (Temporal)   Resp 17   Ht 5\' 8"  (1.727 m)   Wt 171 lb 2 oz (77.6 kg)   SpO2 99%   BMI 26.02  kg/m  Gen: Afebrile. No acute distress. Nontoxic in appearance, well-developed, well-nourished, very pleasant African-American female.  Mildly overweight. HENT: AT. [place]. Bilateral TM visualized and normal in appearance, normal external auditory canal. MMM, no oral lesions, adequate dentition. Bilateral nares within normal limits. Throat without erythema, ulcerations or exudates.  No cough on exam, no hoarseness on exam. Eyes:Pupils Equal Round Reactive to light, Extraocular movements intact,  Conjunctiva without redness, discharge or icterus. Neck/lymp/endocrine: Supple, no lymphadenopathy, no thyromegaly CV: RRR no murmur, no edema, +2/4 P posterior tibialis pulses.  Chest: CTAB, no wheeze, rhonchi or crackles.  Normal respiratory effort.  Good air movement. Abd: Soft.  Flat. NTND. BS present.  No masses palpated. No hepatosplenomegaly. No rebound tenderness or guarding. Skin: No rashes, purpura or petechiae. Warm and well-perfused. Skin intact. Neuro/Msk:  Normal gait. PERLA. EOMi. Alert. Oriented x3.  Cranial nerves II through XII intact. Muscle strength 5/5 upper/lower extremity. DTRs equal bilaterally. Psych: Normal affect, dress and demeanor. Normal speech. Normal thought content and judgment.   No exam data present  Assessment/plan: Sheri Kelly is a 68  y.o. female present for CPE  Elevated hemoglobin A1c/Overweight (BMI 25.0-29.9) Patient is extremely physically active and watches her diet. - Hemoglobin A1c - Lipid panel  Iron overload/anemia Patient was found to have new anemia and elevated iron at her appointment in June.  She has stopped the iron supplementation she was taking. FOBT in August/2020 was negative.  Patient was lost to follow-up on her anemia and iron overload. - Iron, TIBC and Ferritin Panel - CBC w/Diff  Screening for blood or protein in urine - Urinalysis w microscopic + reflex cultur  Family history of breast cancer/Family history of lung cancer/Family history of throat cancer/Family history of leukemia Discussed with her routine preventive screenings are for colon cancer, breast cancer and cervical cancer.  There is no history of ovarian cancer in the family.  He has had a hysterectomy with salpingectomy and right oophorectomy. she follows with GI and GYN.  She has her mammogram scheduled for next month.  She does not meet criteria for lung cancer screening.  She has no symptoms or complaints today.  She has had some mild changes in her CBC in June which showed a mild decrease in her hemoglobin and hematocrit.  She was lost to follow-up to discuss her new anemia. Discussed the role of genetic counseling with her.  She states she is " is paranoid [she is]  going to get cancer," because many of her family members have had different types of cancers. Encourage her to continue her preventative health screenings here and with her specialists and be vigilant to report any new symptoms.  Encounter for preventive health examination Patient was encouraged to exercise greater than 150 minutes a week. Patient was encouraged to choose a diet filled with fresh fruits and vegetables, and lean meats. AVS provided to patient today for education/recommendation on gender specific health and safety maintenance. Colonoscopy:no fhx, Dr.  Deatra Ina, 09/18/2010, no polyps, diverticulosis present. Follow up in 10 years. Mammogram (50-74):Fhx present in mother, mammogram 03/2018, normal.> she has scheduled for next month Cervical cancer screening(<65):Pt has a hystrectomy and follows with GYN. Immunizations:tdap 09/2016. PNA series completed.  Flu shot UTD 2020 and encouraged yearly. Shingrix printed again  Today she forgot about it. Covid vaccines completed.    No follow-ups on file.  Orders Placed This Encounter  Procedures  . Hemoglobin A1c  . Iron, TIBC and Ferritin Panel  . CBC w/Diff  .  Lipid panel  . Urinalysis w microscopic + reflex cultur    Meds ordered this encounter  Medications  . Zoster Vaccine Adjuvanted [facility]) injection    Sig: Inject 0.5 mLs into the muscle once for 1 dose.    Dispense:  0.5 mL    Refill:  0   Referral Orders  No referral(s) requested today     Electronically signed by: Howard Pouch, [place]

## 2019-07-01 NOTE — Patient Instructions (Signed)
Health Maintenance After Age 69 After age 69, you are at a higher risk for certain long-term diseases and infections as well as injuries from falls. Falls are a major cause of broken bones and head injuries in people who are older than age 69. Getting regular preventive care can help to keep you healthy and well. Preventive care includes getting regular testing and making lifestyle changes as recommended by your health care provider. Talk with your health care provider about:  Which screenings and tests you should have. A screening is a test that checks for a disease when you have no symptoms.  A diet and exercise plan that is right for you. What should I know about screenings and tests to prevent falls? Screening and testing are the best ways to find a health problem early. Early diagnosis and treatment give you the best chance of managing medical conditions that are common after age 69. Certain conditions and lifestyle choices may make you more likely to have a fall. Your health care provider may recommend:  Regular vision checks. Poor vision and conditions such as cataracts can make you more likely to have a fall. If you wear glasses, make sure to get your prescription updated if your vision changes.  Medicine review. Work with your health care provider to regularly review all of the medicines you are taking, including over-the-counter medicines. Ask your health care provider about any side effects that may make you more likely to have a fall. Tell your health care provider if any medicines that you take make you feel dizzy or sleepy.  Osteoporosis screening. Osteoporosis is a condition that causes the bones to get weaker. This can make the bones weak and cause them to break more easily.  Blood pressure screening. Blood pressure changes and medicines to control blood pressure can make you feel dizzy.  Strength and balance checks. Your health care provider may recommend certain tests to check your  strength and balance while standing, walking, or changing positions.  Foot health exam. Foot pain and numbness, as well as not wearing proper footwear, can make you more likely to have a fall.  Depression screening. You may be more likely to have a fall if you have a fear of falling, feel emotionally low, or feel unable to do activities that you used to do.  Alcohol use screening. Using too much alcohol can affect your balance and may make you more likely to have a fall. What actions can I take to lower my risk of falls? General instructions  Talk with your health care provider about your risks for falling. Tell your health care provider if: ? You fall. Be sure to tell your health care provider about all falls, even ones that seem minor. ? You feel dizzy, sleepy, or off-balance.  Take over-the-counter and prescription medicines only as told by your health care provider. These include any supplements.  Eat a healthy diet and maintain a healthy weight. A healthy diet includes low-fat dairy products, low-fat (lean) meats, and fiber from whole grains, beans, and lots of fruits and vegetables. Home safety  Remove any tripping hazards, such as rugs, cords, and clutter.  Install safety equipment such as grab bars in bathrooms and safety rails on stairs.  Keep rooms and walkways well-lit. Activity   Follow a regular exercise program to stay fit. This will help you maintain your balance. Ask your health care provider what types of exercise are appropriate for you.  If you need a cane or   walker, use it as recommended by your health care provider.  Wear supportive shoes that have nonskid soles. Lifestyle  Do not drink alcohol if your health care provider tells you not to drink.  If you drink alcohol, limit how much you have: ? 0-1 drink a day for women. ? 0-2 drinks a day for men.  Be aware of how much alcohol is in your drink. In the [place] one drink equals one typical bottle of beer (12  oz), one-half glass of wine (5 oz), or one shot of hard liquor (1 oz).  Do not use any products that contain nicotine or tobacco, such as cigarettes and e-cigarettes. If you need help quitting, ask your health care provider. Summary  Having a healthy lifestyle and getting preventive care can help to protect your health and wellness after age 69.  Screening and testing are the best way to find a health problem early and help you avoid having a fall. Early diagnosis and treatment give you the best chance for managing medical conditions that are more common for people who are older than age 69.  Falls are a major cause of broken bones and head injuries in people who are older than age 69. Take precautions to prevent a fall at home.  Work with your health care provider to learn what changes you can make to improve your health and wellness and to prevent falls. This information is not intended to replace advice given to you by your health care provider. Make sure you discuss any questions you have with your health care provider. Document Revised: 07/23/2018 Document Reviewed: 02/12/2017 Elsevier Patient Education  2020 Elsevier Inc.  

## 2019-07-03 LAB — URINALYSIS W MICROSCOPIC + REFLEX CULTURE
Bacteria, UA: NONE SEEN /HPF
Bilirubin Urine: NEGATIVE
Glucose, UA: NEGATIVE
Hgb urine dipstick: NEGATIVE
Hyaline Cast: NONE SEEN /LPF
Ketones, ur: NEGATIVE
Nitrites, Initial: NEGATIVE
Protein, ur: NEGATIVE
RBC / HPF: NONE SEEN /HPF (ref 0–2)
Specific Gravity, Urine: 1.013 (ref 1.001–1.03)
Squamous Epithelial / LPF: NONE SEEN /HPF (ref <=5)
WBC, UA: NONE SEEN /HPF (ref 0–5)
pH: 6.5 (ref 5.0–8.0)

## 2019-07-03 LAB — URINE CULTURE
MICRO NUMBER:: 10269153
Result:: NO GROWTH
SPECIMEN QUALITY:: ADEQUATE

## 2019-07-03 LAB — IRON,TIBC AND FERRITIN PANEL
%SAT: 32 % (calc) (ref 16–45)
Ferritin: 101 ng/mL (ref 16–288)
Iron: 130 ug/dL (ref 45–160)
TIBC: 410 mcg/dL (calc) (ref 250–450)

## 2019-07-03 LAB — CULTURE INDICATED

## 2019-07-30 ENCOUNTER — Ambulatory Visit
Admission: RE | Admit: 2019-07-30 | Discharge: 2019-07-30 | Disposition: A | Discharge status: Home / Self Care | Payer: Medicare Other | Source: Ambulatory Visit | Attending: Family Medicine | Admitting: Family Medicine

## 2019-07-30 ENCOUNTER — Other Ambulatory Visit: Payer: Self-pay

## 2019-07-30 DIAGNOSIS — Z1231 Encounter for screening mammogram for malignant neoplasm of breast: Secondary | ICD-10-CM

## 2019-08-10 ENCOUNTER — Telehealth: Payer: Self-pay

## 2019-08-10 NOTE — Telephone Encounter (Signed)
Patient walked in and dropped paperwork off at front desk to have Dr. Raoul Pitch to complete. Did not give me time to answer any questions; when asking to filling out our "request for form completion"  Please call patient with any questions.

## 2019-08-11 NOTE — Telephone Encounter (Signed)
Form from [facility] asking for date of CPE and Doctors signature in order for patient to receive gift card for incentive program. Last CPE was 07/01/2019. Form filled out and stamped with provider sig., placed on preaddressed envelope and sent via mail.  Copy placed to be scanned.

## 2019-08-13 ENCOUNTER — Ambulatory Visit: Payer: Medicare Other | Admitting: Family Medicine

## 2019-10-06 ENCOUNTER — Telehealth: Payer: Self-pay

## 2019-10-06 ENCOUNTER — Telehealth: Payer: Self-pay | Admitting: Gastroenterology

## 2019-10-06 DIAGNOSIS — Z1211 Encounter for screening for malignant neoplasm of colon: Secondary | ICD-10-CM

## 2019-10-06 NOTE — Telephone Encounter (Signed)
Patient wants referral to see [facility] to Dr. Youlanda Mighty for her colonoscopy.  She does not want to go back to where she went before  - [facility] - she said she spoke to them recently and they did not seem interested enough to see her so she does not want to continue to go see them.  Patient can be reached at [phone].

## 2019-10-06 NOTE — Telephone Encounter (Signed)
Please advise 

## 2019-10-06 NOTE — Telephone Encounter (Signed)
Okay to place referral to GI of her choice.

## 2019-10-06 NOTE — Telephone Encounter (Signed)
Dr. Ardis Hughs  This pt called wanting to scheduled a Colonoscopy.  She has a recall date of 09-13-2020 (previous Sheri Kelly pt)  She stated she called Medicare and they will pay for one now and she would like to schedule today. Please advise

## 2019-10-07 NOTE — Telephone Encounter (Signed)
GI referral was placed for [facility]. Pt was called and given information.

## 2019-10-07 NOTE — Addendum Note (Signed)
Addended by: Caroll Rancher L on: 10/07/2019 10:06 AM   Modules accepted: Orders

## 2019-10-07 NOTE — Telephone Encounter (Signed)
Colon cancer screening is not necessary (or indicated) until 09/2020.

## 2019-10-07 NOTE — Telephone Encounter (Signed)
Called pt could not leave msg mailbox full.

## 2019-11-09 ENCOUNTER — Encounter: Payer: Self-pay | Admitting: Family Medicine

## 2019-11-09 LAB — HM COLONOSCOPY

## 2019-11-12 ENCOUNTER — Encounter: Payer: Self-pay | Admitting: Family Medicine

## 2019-11-25 ENCOUNTER — Telehealth: Payer: Self-pay

## 2019-11-25 NOTE — Telephone Encounter (Signed)
Result notes from [place] and [place] placed in providers office for review.

## 2019-12-02 ENCOUNTER — Telehealth: Payer: Self-pay | Admitting: Family Medicine

## 2019-12-02 NOTE — Telephone Encounter (Signed)
Left message for patient to schedule Annual Wellness Visit.  Please schedule with Nurse Health Advisor Caroleen Hamman, RN at [facility] Attempted to schedule AWV. Unable to LVM.  Will try at later time.

## 2020-05-10 ENCOUNTER — Telehealth: Payer: Self-pay | Admitting: Family Medicine

## 2020-05-10 NOTE — Telephone Encounter (Signed)
Attempted to schedule AWV. Unable to LVM.  Will try at later time.  

## 2020-05-30 NOTE — Progress Notes (Signed)
Subjective:   Sheri Kelly is a 70 y.o. female who presents for Medicare Annual (Subsequent) preventive examination.  Review of Systems     Cardiac Risk Factors include: advanced age (>86men, >34 women)     Objective:    Today's Vitals   05/31/20 1115  BP: 114/68  Pulse: 63  Resp: 16  Temp: 98.2 F (36.8 C)  TempSrc: Oral  SpO2: 99%  Weight: 171 lb (77.6 kg)  Height: 5\' 8"  (1.727 m)   Body mass index is 26 kg/m.  Advanced Directives 05/31/2020 10/02/2018 09/26/2017 05/12/2017 05/06/2017  Does Patient Have a Medical Advance Directive? Yes Yes Yes Yes Yes  Type of Paramedic of [place];Living will [place];Living will Living will;Healthcare Power of Attorney - Living will;Healthcare Power of Attorney  Does patient want to make changes to medical advance directive? - - - No - Patient declined -  Copy of [place] in Chart? No - copy requested No - copy requested No - copy requested - -    Current Medications (verified) Outpatient Encounter Medications as of 05/31/2020  Medication Sig  . Biotin 10 MG CAPS Take 1 tablet by mouth daily.  . cholecalciferol (VITAMIN D) 1000 units tablet Take 1,000 Units by mouth daily.  . Flaxseed, Linseed, (FLAX SEED OIL) 1000 MG CAPS Take by mouth.  . fluticasone (FLONASE) 50 MCG/ACT nasal spray Place into both nostrils daily.  Marland Kitchen glucosamine-chondroitin 500-400 MG tablet Take 1 tablet by mouth 3 (three) times daily.  . Multiple Vitamin (MULTIVITAMIN) tablet Take 1 tablet by mouth daily.  . Omega 3-6-9 Fatty Acids (OMEGA 3-6-9 COMPLEX PO) Take 1 capsule by mouth daily.  . Turmeric (CURCUMIN 95 PO) Take by mouth.   No facility-administered encounter medications on file as of 05/31/2020.    Allergies (verified) Dust mite extract and Molds & smuts   History: Past Medical History:  Diagnosis Date  . Arthritis   . Cervical dysplasia   . Cervical polyp 01/14/2008  . Complication of  anesthesia    had ileus after hysterectomy 2011  . History of breast reconstruction    Bilateral   . History of chicken pox   . History of measles, mumps, or rubella   . Infertility, female   . Menopausal symptoms   . Trigger finger of all digits of right hand    RMF  . Uterine fibroid    Past Surgical History:  Procedure Laterality Date  . ABDOMINAL HYSTERECTOMY  09/19/2009-09/22/2009   total  . BREAST SURGERY  1998   "breast lift"  . CESAREAN SECTION      x 2  . INTRAOCULAR LENS INSERTION Bilateral   . MOUTH SURGERY    . SALPINGOOPHORECTOMY  09/19/2009   right  . TRIGGER FINGER RELEASE Right 05/12/2017   Procedure: RIGHT TRIGGER FINGER RELEASE;  Surgeon: Cristine Polio, MD;  Location: [facility];  Service: Plastics;  Laterality: Right;  . TUBAL LIGATION     bilateral  . UTERINE FIBROID SURGERY     Family History  Problem Relation Age of Onset  . Arthritis Mother   . Breast cancer Mother   . Lung cancer Father   . Hypertension Brother   . Throat cancer Brother   . Leukemia Sister   . Hypertension Son    Social History   Socioeconomic History  . Marital status: Divorced    Spouse name: Not on file  . Number of children: 1  . Years  of education: 16  . Highest education level: Not on file  Occupational History  . Occupation: Armed forces operational officer- [employer]  Tobacco Use  . Smoking status: Former Smoker    Quit date: 09/11/2009    Years since quitting: 10.7  . Smokeless tobacco: Never Used  . Tobacco comment: socially only for a few years  Vaping Use  . Vaping Use: Never used  Substance and Sexual Activity  . Alcohol use: Yes    Alcohol/week: 5.0 standard drinks    Types: 5 Glasses of wine per week    Comment: social  . Drug use: No  . Sexual activity: Never  Other Topics Concern  . Not on file  Social History Narrative   Divorced. 1 child ([name]).   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    Drinks caffeine, uses herbal remedies. Takes a daily  vitamin.    Wears seatbelt, bicycle helmet and dentures.    Smoke detector in the home. Firearms locked in the home.    Feels safe in her relationships.    Social Determinants of Health   Financial Resource Strain: Low Risk   . Difficulty of Paying Living Expenses: Not hard at all  Food Insecurity: No Food Insecurity  . Worried About Charity fundraiser in the Last Year: Never true  . Ran Out of Food in the Last Year: Never true  Transportation Needs: No Transportation Needs  . Lack of Transportation (Medical): No  . Lack of Transportation (Non-Medical): No  Physical Activity: Insufficiently Active  . Days of Exercise per Week: 2 days  . Minutes of Exercise per Session: 60 min  Stress: No Stress Concern Present  . Feeling of Stress : Only a little  Social Connections: Moderately Isolated  . Frequency of Communication with Friends and Family: More than three times a week  . Frequency of Social Gatherings with Friends and Family: More than three times a week  . Attends Religious Services: More than 4 times per year  . Active Member of Clubs or Organizations: No  . Attends Archivist Meetings: Never  . Marital Status: Divorced    Tobacco Counseling Counseling given: Not Answered Comment: socially only for a few years   Clinical Intake:  Pre-visit preparation completed: Yes  Pain : No/denies pain     Nutritional Status: BMI 25 -29 Overweight Nutritional Risks: None Diabetes: No  How often do you need to have someone help you when you read instructions, pamphlets, or other written materials from your doctor or pharmacy?: 1 - Never  Diabetic?No  Interpreter Needed?: No  Information entered by :: Caroleen Hamman LPN   Activities of Daily Living In your present state of health, do you have any difficulty performing the following activities: 05/31/2020  Hearing? N  Vision? N  Difficulty concentrating or making decisions? Y  Comment occasionally forgets names  & where she places things  Walking or climbing stairs? N  Dressing or bathing? N  Doing errands, shopping? N  Preparing Food and eating ? N  Using the Toilet? N  In the past six months, have you accidently leaked urine? N  Do you have problems with loss of bowel control? N  Managing your Medications? N  Managing your Finances? N  Housekeeping or managing your Housekeeping? N  Some recent data might be hidden    Patient Care Team: Ma Hillock, DO as PCP - General (Family Medicine) Inda Castle, MD (Inactive) as Consulting Physician (Gastroenterology) Mancel Bale,  Levada Dy, MD as Consulting Physician (Obstetrics and Gynecology) Laurence Aly, Dasher (Optometry) Gaynelle Arabian, MD as Consulting Physician (Orthopedic Surgery)  Indicate any recent Medical Services you may have received from other than [facility] providers in the past year (date may be approximate).     Assessment:   This is a routine wellness examination for [place].  Hearing/Vision screen  Hearing Screening   125Hz  250Hz  500Hz  1000Hz  2000Hz  3000Hz  4000Hz  6000Hz  8000Hz   Right ear:           Left ear:           Comments: No issues  Vision Screening Comments: Wears glasses Last eye exam-12/2019-[facility]  Dietary issues and exercise activities discussed: Current Exercise Habits: Home exercise routine, Type of exercise: strength training/weights;Other - see comments (cardio), Time (Minutes): 60, Frequency (Times/Week): 2, Weekly Exercise (Minutes/Week): 120, Intensity: Mild, Exercise limited by: None identified  Goals    . Exercise 150 min/wk Moderate Activity     Increase exercise.     . Patient Stated     Stop stressing over things I can't control      Depression Screen PHQ 2/9 Scores 05/31/2020 10/02/2018 12/03/2017 09/26/2017 09/25/2016 08/02/2015  PHQ - 2 Score 1 0 0 0 0 0    Fall Risk Fall Risk  05/31/2020 10/02/2018 12/03/2017 09/26/2017 09/25/2016  Falls in the past year? 0 0 No No No  Number falls in past yr: 0 - - -  -  Injury with Fall? 0 - - - -  Follow up Falls prevention discussed Education provided;Falls evaluation completed;Falls prevention discussed - - -    FALL RISK PREVENTION PERTAINING TO THE HOME:  Any stairs in or around the home? Yes  If so, are there any without handrails? No  Home free of loose throw rugs in walkways, pet beds, electrical cords, etc? Yes  Adequate lighting in your home to reduce risk of falls? Yes   ASSISTIVE DEVICES UTILIZED TO PREVENT FALLS:  Life alert? No  Use of a cane, walker or w/c? No  Grab bars in the bathroom? Yes  Shower chair or bench in shower? No  Elevated toilet seat or a handicapped toilet? No   TIMED UP AND GO:  Was the test performed? Yes .  Length of time to ambulate 10 feet: 9 sec.   Gait steady and fast without use of assistive device  Cognitive Function:Normal cognitive status assessed by direct observation by this Nurse Health Advisor. No abnormalities found.          Immunizations Immunization History  Administered Date(s) Administered  . Influenza, High Dose Seasonal PF 02/24/2018  . Influenza,inj,quad, With Preservative 11/13/2016  . Influenza-Unspecified 12/15/2018  . PFIZER(Purple Top)SARS-COV-2 Vaccination 05/07/2019, 05/28/2019, 12/14/2019  . Pneumococcal Conjugate-13 09/25/2016  . Pneumococcal Polysaccharide-23 10/02/2018    TDAP status: Up to date  Flu Vaccine status: Declined, Education has been provided regarding the importance of this vaccine but patient still declined. Advised may receive this vaccine at local pharmacy or Health Dept. Aware to provide a copy of the vaccination record if obtained from local pharmacy or Health Dept. Verbalized acceptance and understanding.  Pneumococcal vaccine status: Up to date  Covid-19 vaccine status: Completed vaccines  Qualifies for Shingles Vaccine? Yes   Zostavax completed No   Shingrix Completed?: No.    Education has been provided regarding the importance of this  vaccine. Patient has been advised to call insurance company to determine out of pocket expense if they have not yet received  this vaccine. Advised may also receive vaccine at local pharmacy or Health Dept. Verbalized acceptance and understanding.  Screening Tests Health Maintenance  Topic Date Due  . INFLUENZA VACCINE  11/14/2019  . COVID-19 Vaccine (4 - Booster for [place] series) 06/12/2020  . MAMMOGRAM  07/29/2021  . TETANUS/TDAP  09/26/2026  . COLONOSCOPY (Pts 45-58yrs Insurance coverage will need to be confirmed)  11/09/2026  . DEXA SCAN  Completed  . Hepatitis C Screening  Completed  . PNA vac Low Risk Adult  Completed    Health Maintenance  Health Maintenance Due  Topic Date Due  . INFLUENZA VACCINE  11/14/2019    Colorectal cancer screening: Type of screening: Colonoscopy. Completed 11/09/2019. Repeat every 7 years  Mammogram status: Completed Bilateral-07/30/2019. Repeat every year Ordered today-to be scheduled after 07/29/20  Bone Density status: Ordered today. Pt provided with contact info and advised to call to schedule appt.  Lung Cancer Screening: (Low Dose CT Chest recommended if Age 53-80 years, 30 pack-year currently smoking OR have quit w/in 15years.) does not qualify.     Additional Screening:  Hepatitis C Screening:Completed 08/02/2015  Vision Screening: Recommended annual ophthalmology exams for early detection of glaucoma and other disorders of the eye. Is the patient up to date with their annual eye exam?  Yes  Who is the provider or what is the name of the office in which the patient attends annual eye exams? [facility] .   [place] Screening: Recommended annual dental exams for proper oral hygiene  Community Resource Referral / Chronic Care Management: CRR required this visit?  No   CCM required this visit?  No      Plan:     I have personally reviewed and noted the following in the patient's chart:   . Medical and social history . Use of  alcohol, tobacco or illicit drugs  . Current medications and supplements . Functional ability and status . Nutritional status . Physical activity . Advanced directives . List of other physicians . Hospitalizations, surgeries, and ER visits in previous 12 months . Vitals . Screenings to include cognitive, depression, and falls . Referrals and appointments  In addition, I have reviewed and discussed with patient certain preventive protocols, quality metrics, and best practice recommendations. A written personalized care plan for preventive services as well as general preventive health recommendations were provided to patient.   Patient would like to access avs on mychart.  Marta Antu, LPN   [id number]  Nurse Health Advisor  Nurse Notes: None

## 2020-05-31 ENCOUNTER — Other Ambulatory Visit: Payer: Self-pay

## 2020-05-31 ENCOUNTER — Ambulatory Visit (INDEPENDENT_AMBULATORY_CARE_PROVIDER_SITE_OTHER): Payer: Medicare HMO

## 2020-05-31 VITALS — BP 114/68 | HR 63 | Temp 98.2°F | Resp 16 | Ht 68.0 in | Wt 171.0 lb

## 2020-05-31 DIAGNOSIS — Z1231 Encounter for screening mammogram for malignant neoplasm of breast: Secondary | ICD-10-CM | POA: Diagnosis not present

## 2020-05-31 DIAGNOSIS — Z0000 Encounter for general adult medical examination without abnormal findings: Secondary | ICD-10-CM | POA: Diagnosis not present

## 2020-05-31 DIAGNOSIS — Z780 Asymptomatic menopausal state: Secondary | ICD-10-CM

## 2020-05-31 NOTE — Patient Instructions (Signed)
Sheri Kelly , Thank you for taking time to come for your Medicare Wellness Visit. I appreciate your ongoing commitment to your health goals. Please review the following plan we discussed and let me know if I can assist you in the future.   Screening recommendations/referrals: Colonoscopy: Completed 11/09/2019-Due 11/09/2022 Mammogram: Completed 07/30/2019-Ordered today. Someone will be calling you to schedule. Bone Density: Due-Ordered today. Someone will be calling you to schedule. Recommended yearly ophthalmology/optometry visit for glaucoma screening and checkup Recommended yearly dental visit for hygiene and checkup  Vaccinations: Influenza vaccine: Declined Pneumococcal vaccine: Completed vaccines Tdap vaccine: Up to date-Due 09/26/2026 Shingles vaccine: Discuss with pharmacy  Covid-19:Completed vaccines  Advanced directives: Please bring a copy for your chart.  Conditions/risks identified: See problem list  Next appointment: Follow up in one year for your annual wellness visit 06/06/2021 @ 11:15   Preventive Care 70 Years and Older, Female Preventive care refers to lifestyle choices and visits with your health care provider that can promote health and wellness. What does preventive care include?  A yearly physical exam. This is also called an annual well check.  Dental exams once or twice a year.  Routine eye exams. Ask your health care provider how often you should have your eyes checked.  Personal lifestyle choices, including:  Daily care of your teeth and gums.  Regular physical activity.  Eating a healthy diet.  Avoiding tobacco and drug use.  Limiting alcohol use.  Practicing safe sex.  Taking low-dose aspirin every day.  Taking vitamin and mineral supplements as recommended by your health care provider. What happens during an annual well check? The services and screenings done by your health care provider during your annual well check will depend on your age,  overall health, lifestyle risk factors, and family history of disease. Counseling  Your health care provider may ask you questions about your:  Alcohol use.  Tobacco use.  Drug use.  Emotional well-being.  Home and relationship well-being.  Sexual activity.  Eating habits.  History of falls.  Memory and ability to understand (cognition).  Work and work Statistician.  Reproductive health. Screening  You may have the following tests or measurements:  Height, weight, and BMI.  Blood pressure.  Lipid and cholesterol levels. These may be checked every 5 years, or more frequently if you are over 34 years old.  Skin check.  Lung cancer screening. You may have this screening every year starting at age 54 if you have a 30-pack-year history of smoking and currently smoke or have quit within the past 15 years.  Fecal occult blood test (FOBT) of the stool. You may have this test every year starting at age 6.  Flexible sigmoidoscopy or colonoscopy. You may have a sigmoidoscopy every 5 years or a colonoscopy every 10 years starting at age 68.  Hepatitis C blood test.  Hepatitis B blood test.  Sexually transmitted disease (STD) testing.  Diabetes screening. This is done by checking your blood sugar (glucose) after you have not eaten for a while (fasting). You may have this done every 1-3 years.  Bone density scan. This is done to screen for osteoporosis. You may have this done starting at age 5.  Mammogram. This may be done every 1-2 years. Talk to your health care provider about how often you should have regular mammograms. Talk with your health care provider about your test results, treatment options, and if necessary, the need for more tests. Vaccines  Your health care provider may recommend certain  vaccines, such as:  Influenza vaccine. This is recommended every year.  Tetanus, diphtheria, and acellular pertussis (Tdap, Td) vaccine. You may need a Td booster every 10  years.  Zoster vaccine. You may need this after age 5.  Pneumococcal 13-valent conjugate (PCV13) vaccine. One dose is recommended after age 76.  Pneumococcal polysaccharide (PPSV23) vaccine. One dose is recommended after age 75. Talk to your health care provider about which screenings and vaccines you need and how often you need them. This information is not intended to replace advice given to you by your health care provider. Make sure you discuss any questions you have with your health care provider. Document Released: 04/28/2015 Document Revised: 12/20/2015 Document Reviewed: 01/31/2015 Elsevier Interactive Patient Education  2017 [place] Prevention in the Home Falls can cause injuries. They can happen to people of all ages. There are many things you can do to make your home safe and to help prevent falls. What can I do on the outside of my home?  Regularly fix the edges of walkways and driveways and fix any cracks.  Remove anything that might make you trip as you walk through a door, such as a raised step or threshold.  Trim any bushes or trees on the path to your home.  Use bright outdoor lighting.  Clear any walking paths of anything that might make someone trip, such as rocks or tools.  Regularly check to see if handrails are loose or broken. Make sure that both sides of any steps have handrails.  Any raised decks and porches should have guardrails on the edges.  Have any leaves, snow, or ice cleared regularly.  Use sand or salt on walking paths during winter.  Clean up any spills in your garage right away. This includes oil or grease spills. What can I do in the bathroom?  Use night lights.  Install grab bars by the toilet and in the tub and shower. Do not use towel bars as grab bars.  Use non-skid mats or decals in the tub or shower.  If you need to sit down in the shower, use a plastic, non-slip stool.  Keep the floor dry. Clean up any water that  spills on the floor as soon as it happens.  Remove soap buildup in the tub or shower regularly.  Attach bath mats securely with double-sided non-slip rug tape.  Do not have throw rugs and other things on the floor that can make you trip. What can I do in the bedroom?  Use night lights.  Make sure that you have a light by your bed that is easy to reach.  Do not use any sheets or blankets that are too big for your bed. They should not hang down onto the floor.  Have a firm chair that has side arms. You can use this for support while you get dressed.  Do not have throw rugs and other things on the floor that can make you trip. What can I do in the kitchen?  Clean up any spills right away.  Avoid walking on wet floors.  Keep items that you use a lot in easy-to-reach places.  If you need to reach something above you, use a strong step stool that has a grab bar.  Keep electrical cords out of the way.  Do not use floor polish or wax that makes floors slippery. If you must use wax, use non-skid floor wax.  Do not have throw rugs and other things  on the floor that can make you trip. What can I do with my stairs?  Do not leave any items on the stairs.  Make sure that there are handrails on both sides of the stairs and use them. Fix handrails that are broken or loose. Make sure that handrails are as long as the stairways.  Check any carpeting to make sure that it is firmly attached to the stairs. Fix any carpet that is loose or worn.  Avoid having throw rugs at the top or bottom of the stairs. If you do have throw rugs, attach them to the floor with carpet tape.  Make sure that you have a light switch at the top of the stairs and the bottom of the stairs. If you do not have them, ask someone to add them for you. What else can I do to help prevent falls?  Wear shoes that:  Do not have high heels.  Have rubber bottoms.  Are comfortable and fit you well.  Are closed at the  toe. Do not wear sandals.  If you use a stepladder:  Make sure that it is fully opened. Do not climb a closed stepladder.  Make sure that both sides of the stepladder are locked into place.  Ask someone to hold it for you, if possible.  Clearly mark and make sure that you can see:  Any grab bars or handrails.  First and last steps.  Where the edge of each step is.  Use tools that help you move around (mobility aids) if they are needed. These include:  Canes.  Walkers.  Scooters.  Crutches.  Turn on the lights when you go into a dark area. Replace any light bulbs as soon as they burn out.  Set up your furniture so you have a clear path. Avoid moving your furniture around.  If any of your floors are uneven, fix them.  If there are any pets around you, be aware of where they are.  Review your medicines with your doctor. Some medicines can make you feel dizzy. This can increase your chance of falling. Ask your doctor what other things that you can do to help prevent falls. This information is not intended to replace advice given to you by your health care provider. Make sure you discuss any questions you have with your health care provider. Document Released: 01/26/2009 Document Revised: 09/07/2015 Document Reviewed: 05/06/2014 Elsevier Interactive Patient Education  2017 [employer].

## 2020-06-01 ENCOUNTER — Other Ambulatory Visit: Payer: Self-pay | Admitting: Family Medicine

## 2020-06-01 DIAGNOSIS — Z780 Asymptomatic menopausal state: Secondary | ICD-10-CM

## 2020-06-19 DIAGNOSIS — H2513 Age-related nuclear cataract, bilateral: Secondary | ICD-10-CM | POA: Diagnosis not present

## 2020-06-19 DIAGNOSIS — H04123 Dry eye syndrome of bilateral lacrimal glands: Secondary | ICD-10-CM | POA: Diagnosis not present

## 2020-07-13 DIAGNOSIS — K5900 Constipation, unspecified: Secondary | ICD-10-CM | POA: Insufficient documentation

## 2020-07-13 DIAGNOSIS — Z800 Family history of malignant neoplasm of digestive organs: Secondary | ICD-10-CM | POA: Insufficient documentation

## 2020-07-13 DIAGNOSIS — K5730 Diverticulosis of large intestine without perforation or abscess without bleeding: Secondary | ICD-10-CM | POA: Insufficient documentation

## 2020-07-14 ENCOUNTER — Ambulatory Visit (INDEPENDENT_AMBULATORY_CARE_PROVIDER_SITE_OTHER): Payer: Medicare HMO | Admitting: Family Medicine

## 2020-07-14 ENCOUNTER — Encounter: Payer: Self-pay | Admitting: Family Medicine

## 2020-07-14 ENCOUNTER — Other Ambulatory Visit: Payer: Self-pay

## 2020-07-14 VITALS — BP 108/70 | HR 61 | Temp 98.1°F | Wt 171.0 lb

## 2020-07-14 DIAGNOSIS — H61893 Other specified disorders of external ear, bilateral: Secondary | ICD-10-CM

## 2020-07-14 MED ORDER — MOMETASONE FUROATE 0.1 % EX SOLN: Freq: Every day | CUTANEOUS | 2 refills | Status: DC

## 2020-07-14 NOTE — Progress Notes (Signed)
This visit occurred during the SARS-CoV-2 public health emergency.  Safety protocols were in place, including screening questions prior to the visit, additional usage of staff PPE, and extensive cleaning of exam room while observing appropriate contact time as indicated for disinfecting solutions.    Sheri Kelly , [birth year], 70 y.o., female MRN: [id number] Patient Care Team    Relationship Specialty Notifications Start End  Ma Hillock, DO PCP - General Family Medicine  09/25/16   Everett Graff, MD Consulting Physician Obstetrics and Gynecology  09/25/16   Laurence Aly, OD  Optometry  09/25/16    Comment: [facility]  Gaynelle Arabian, MD Consulting Physician Orthopedic Surgery  09/26/17   Carol Ada, MD Consulting Physician Gastroenterology  07/14/20     Chief Complaint  Patient presents with  . Ear Fullness    Pt c/o fluid and itching inner ear x 2 mos; pt had seen ENT for same issue 4 years ago an was given mometasone furoate 0.1%     Subjective: Pt presents for an OV with complaints of bilateral ear fullness right>left of few weeks duration.  Associated symptoms include nothing- just itchy feeling and full ears. She was seen by an ENT a few years ago and provided with a topical lotion that was helpful.    Depression screen [facility] 2/9 05/31/2020 10/02/2018 12/03/2017 09/26/2017 09/25/2016  Decreased Interest 0 0 0 0 0  Down, Depressed, Hopeless 1 0 - 0 0  PHQ - 2 Score 1 0 0 0 0    Allergies  Allergen Reactions  . Dust Mite Extract   . Molds & Smuts    Social History   Social History Narrative   Divorced. 1 child ([name]).   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    Drinks caffeine, uses herbal remedies. Takes a daily vitamin.    Wears seatbelt, bicycle helmet and dentures.    Smoke detector in the home. Firearms locked in the home.    Feels safe in her relationships.    Past Medical History:  Diagnosis Date  . Arthritis   . Cervical dysplasia   . Cervical  polyp 01/14/2008  . Complication of anesthesia    had ileus after hysterectomy 2011  . History of breast reconstruction    Bilateral   . History of chicken pox   . History of measles, mumps, or rubella   . Infertility, female   . Menopausal symptoms   . Trigger finger of all digits of right hand    RMF  . Uterine fibroid    Past Surgical History:  Procedure Laterality Date  . ABDOMINAL HYSTERECTOMY  09/19/2009-09/22/2009   total  . BREAST SURGERY  1998   "breast lift"  . CESAREAN SECTION      x 2  . INTRAOCULAR LENS INSERTION Bilateral   . MOUTH SURGERY    . SALPINGOOPHORECTOMY  09/19/2009   right  . TRIGGER FINGER RELEASE Right 05/12/2017   Procedure: RIGHT TRIGGER FINGER RELEASE;  Surgeon: Cristine Polio, MD;  Location: [facility];  Service: Plastics;  Laterality: Right;  . TUBAL LIGATION     bilateral  . UTERINE FIBROID SURGERY     Family History  Problem Relation Age of Onset  . Arthritis Mother   . Breast cancer Mother   . Lung cancer Father   . Hypertension Brother   . Throat cancer Brother   . Leukemia Sister   . Hypertension Son    Allergies as of  07/14/2020      Reactions   Dust Mite Extract    Molds & Smuts       Medication List       Accurate as of July 14, 2020 10:07 AM. If you have any questions, ask your nurse or doctor.        STOP taking these medications   Biotin 10 MG Caps Stopped by: Howard Pouch, DO   cholecalciferol 1000 units tablet Commonly known as: VITAMIN D Stopped by: Howard Pouch, DO   CURCUMIN 95 PO Stopped by: Howard Pouch, DO   Flax Seed Oil 1000 MG Caps Stopped by: Howard Pouch, DO   fluticasone 50 MCG/ACT nasal spray Commonly known as: FLONASE Stopped by: Howard Pouch, DO   OMEGA 3-6-9 COMPLEX PO Stopped by: Howard Pouch, DO     TAKE these medications   glucosamine-chondroitin 500-400 MG tablet Take 1 tablet by mouth 3 (three) times daily.   mometasone 0.1 % lotion Commonly known as:  ELOCON Apply topically daily. Started by: Howard Pouch, DO   multivitamin tablet Take 1 tablet by mouth daily.       All past medical history, surgical history, allergies, family history, immunizations andmedications were updated in the EMR today and reviewed under the history and medication portions of their EMR.     ROS: Negative, with the exception of above mentioned in HPI   Objective:  BP 108/70   Pulse 61   Temp 98.1 F (36.7 C) (Oral)   Wt 171 lb (77.6 kg)   SpO2 98%   BMI 26.00 kg/m  Body mass index is 26 kg/m. Gen: Afebrile. No acute distress. Nontoxic in appearance, well developed, well nourished.  HENT: AT. [place]. Bilateral TM visualized normal - dry EAC. MMM Eyes:Pupils Equal Round Reactive to light, Extraocular movements intact,  Conjunctiva without redness, discharge or icterus.  No exam data present No results found. No results found for this or any previous visit (from the past 24 hour(s)).  Assessment/Plan: Sheri Kelly is a 70 y.o. female present for OV for  Irritation of external ear canal, bilateral Dry EAC. Start steroid lotion QD PRn to EAC. F/u PRN   Reviewed expectations re: course of current medical issues.  Discussed self-management of symptoms.  Outlined signs and symptoms indicating need for more acute intervention.  Patient verbalized understanding and all questions were answered.  Patient received an After-Visit Summary.    No orders of the defined types were placed in this encounter.  Meds ordered this encounter  Medications  . mometasone (ELOCON) 0.1 % lotion    Sig: Apply topically daily.    Dispense:  60 mL    Refill:  2   Referral Orders  No referral(s) requested today     Note is dictated utilizing voice recognition software. Although note has been proof read prior to signing, occasional typographical errors still can be missed. If any questions arise, please do not hesitate to call for verification.    electronically signed by:  Howard Pouch, DO  [place]

## 2020-07-14 NOTE — Patient Instructions (Addendum)
Apply lotion to ears daily as needed only.  Start Flonase nasal spray.   Have fun and HAPPY BIRTHDAY!!!

## 2020-07-17 DIAGNOSIS — Z20822 Contact with and (suspected) exposure to covid-19: Secondary | ICD-10-CM | POA: Diagnosis not present

## 2020-07-19 DIAGNOSIS — Z1152 Encounter for screening for COVID-19: Secondary | ICD-10-CM | POA: Diagnosis not present

## 2020-08-02 DIAGNOSIS — H2513 Age-related nuclear cataract, bilateral: Secondary | ICD-10-CM | POA: Diagnosis not present

## 2020-08-02 DIAGNOSIS — H04123 Dry eye syndrome of bilateral lacrimal glands: Secondary | ICD-10-CM | POA: Diagnosis not present

## 2020-11-03 ENCOUNTER — Ambulatory Visit
Admission: RE | Admit: 2020-11-03 | Discharge: 2020-11-03 | Disposition: A | Discharge status: Home / Self Care | Payer: Medicare HMO | Source: Ambulatory Visit | Attending: Family Medicine | Admitting: Family Medicine

## 2020-11-03 ENCOUNTER — Other Ambulatory Visit: Payer: Self-pay

## 2020-11-03 DIAGNOSIS — Z780 Asymptomatic menopausal state: Secondary | ICD-10-CM | POA: Diagnosis not present

## 2020-11-03 DIAGNOSIS — Z1231 Encounter for screening mammogram for malignant neoplasm of breast: Secondary | ICD-10-CM

## 2020-11-06 ENCOUNTER — Telehealth: Payer: Self-pay | Admitting: Family Medicine

## 2020-11-06 NOTE — Telephone Encounter (Signed)
Please inform patient her bone density test is normal.  She has great density in her bones for 70 year old.

## 2020-11-06 NOTE — Telephone Encounter (Signed)
Spoke with pt regarding labs and instructions.   

## 2020-11-07 ENCOUNTER — Encounter: Payer: Self-pay | Admitting: Family Medicine

## 2020-11-07 DIAGNOSIS — R928 Other abnormal and inconclusive findings on diagnostic imaging of breast: Secondary | ICD-10-CM | POA: Insufficient documentation

## 2020-11-08 ENCOUNTER — Other Ambulatory Visit: Payer: Self-pay | Admitting: Family Medicine

## 2020-11-08 DIAGNOSIS — R928 Other abnormal and inconclusive findings on diagnostic imaging of breast: Secondary | ICD-10-CM

## 2020-11-18 ENCOUNTER — Other Ambulatory Visit: Payer: Self-pay

## 2020-11-18 ENCOUNTER — Ambulatory Visit
Admission: RE | Admit: 2020-11-18 | Discharge: 2020-11-18 | Disposition: A | Discharge status: Home / Self Care | Payer: Medicare HMO | Source: Ambulatory Visit | Attending: Family Medicine | Admitting: Family Medicine

## 2020-11-18 DIAGNOSIS — R922 Inconclusive mammogram: Secondary | ICD-10-CM | POA: Diagnosis not present

## 2020-11-18 DIAGNOSIS — R928 Other abnormal and inconclusive findings on diagnostic imaging of breast: Secondary | ICD-10-CM

## 2020-11-24 ENCOUNTER — Other Ambulatory Visit: Payer: Medicare HMO

## 2021-01-08 ENCOUNTER — Other Ambulatory Visit: Payer: Self-pay

## 2021-01-08 ENCOUNTER — Ambulatory Visit (INDEPENDENT_AMBULATORY_CARE_PROVIDER_SITE_OTHER): Payer: Medicare HMO | Admitting: Family Medicine

## 2021-01-08 ENCOUNTER — Encounter: Payer: Self-pay | Admitting: Family Medicine

## 2021-01-08 VITALS — BP 89/53 | HR 58 | Temp 97.7°F | Ht 67.75 in | Wt 170.0 lb

## 2021-01-08 DIAGNOSIS — Z0000 Encounter for general adult medical examination without abnormal findings: Secondary | ICD-10-CM

## 2021-01-08 DIAGNOSIS — Z131 Encounter for screening for diabetes mellitus: Secondary | ICD-10-CM

## 2021-01-08 DIAGNOSIS — Z130 Encounter for screening for diseases of the blood and blood-forming organs and certain disorders involving the immune mechanism: Secondary | ICD-10-CM | POA: Diagnosis not present

## 2021-01-08 DIAGNOSIS — Z23 Encounter for immunization: Secondary | ICD-10-CM

## 2021-01-08 DIAGNOSIS — Z13220 Encounter for screening for lipoid disorders: Secondary | ICD-10-CM | POA: Diagnosis not present

## 2021-01-08 LAB — CBC WITH DIFFERENTIAL/PLATELET
Basophils Absolute: 0 10*3/uL (ref 0.0–0.1)
Basophils Relative: 0.6 % (ref 0.0–3.0)
Eosinophils Absolute: 0 10*3/uL (ref 0.0–0.7)
Eosinophils Relative: 0.7 % (ref 0.0–5.0)
HCT: 35.9 % — ABNORMAL LOW (ref 36.0–46.0)
Hemoglobin: 11.8 g/dL — ABNORMAL LOW (ref 12.0–15.0)
Lymphocytes Relative: 30.8 % (ref 12.0–46.0)
Lymphs Abs: 1.1 10*3/uL (ref 0.7–4.0)
MCHC: 32.9 g/dL (ref 30.0–36.0)
MCV: 83.9 fl (ref 78.0–100.0)
Monocytes Absolute: 0.4 10*3/uL (ref 0.1–1.0)
Monocytes Relative: 10.3 % (ref 3.0–12.0)
Neutro Abs: 2.1 10*3/uL (ref 1.4–7.7)
Neutrophils Relative %: 57.6 % (ref 43.0–77.0)
Platelets: 260 10*3/uL (ref 150.0–400.0)
RBC: 4.28 Mil/uL (ref 3.87–5.11)
RDW: 15 % (ref 11.5–15.5)
WBC: 3.7 10*3/uL — ABNORMAL LOW (ref 4.0–10.5)

## 2021-01-08 LAB — COMPREHENSIVE METABOLIC PANEL
ALT: 14 U/L (ref 0–35)
AST: 17 U/L (ref 0–37)
Albumin: 4.4 g/dL (ref 3.5–5.2)
Alkaline Phosphatase: 57 U/L (ref 39–117)
BUN: 19 mg/dL (ref 6–23)
CO2: 30 mEq/L (ref 19–32)
Calcium: 9.9 mg/dL (ref 8.4–10.5)
Chloride: 103 mEq/L (ref 96–112)
Creatinine, Ser: 0.93 mg/dL (ref 0.40–1.20)
GFR: 62.29 mL/min (ref >60.00)
Glucose, Bld: 75 mg/dL (ref 70–99)
Potassium: 4.9 mEq/L (ref 3.5–5.1)
Sodium: 139 mEq/L (ref 135–145)
Total Bilirubin: 0.4 mg/dL (ref 0.2–1.2)
Total Protein: 7.1 g/dL (ref 6.0–8.3)

## 2021-01-08 LAB — LIPID PANEL
Cholesterol: 250 mg/dL — ABNORMAL HIGH (ref 0–200)
HDL: 107 mg/dL (ref >39.00)
LDL Cholesterol: 118 mg/dL — ABNORMAL HIGH (ref 0–99)
NonHDL: 142.65
Total CHOL/HDL Ratio: 2
Triglycerides: 123 mg/dL (ref 0.0–149.0)
VLDL: 24.6 mg/dL (ref 0.0–40.0)

## 2021-01-08 LAB — HEMOGLOBIN A1C: Hgb A1c MFr Bld: 5.8 % (ref 4.6–6.5)

## 2021-01-08 NOTE — Patient Instructions (Signed)
Great to see you today.  I have refilled the medication(s) we provide.   If labs were collected, we will inform you of lab results once received either by echart message or telephone call.   - echart message- for normal results that have been seen by the patient already.   - telephone call: abnormal results or if patient has not viewed results in their echart. Health Maintenance, Female Adopting a healthy lifestyle and getting preventive care are important in promoting health and wellness. Ask your health care provider about: The right schedule for you to have regular tests and exams. Things you can do on your own to prevent diseases and keep yourself healthy. What should I know about diet, weight, and exercise? Eat a healthy diet  Eat a diet that includes plenty of vegetables, fruits, low-fat dairy products, and lean protein. Do not eat a lot of foods that are high in solid fats, added sugars, or sodium. Maintain a healthy weight Body mass index (BMI) is used to identify weight problems. It estimates body fat based on height and weight. Your health care provider can help determine your BMI and help you achieve or maintain a healthy weight. Get regular exercise Get regular exercise. This is one of the most important things you can do for your health. Most adults should: Exercise for at least 150 minutes each week. The exercise should increase your heart rate and make you sweat (moderate-intensity exercise). Do strengthening exercises at least twice a week. This is in addition to the moderate-intensity exercise. Spend less time sitting. Even light physical activity can be beneficial. Watch cholesterol and blood lipids Have your blood tested for lipids and cholesterol at 70 years of age, then have this test every 5 years. Have your cholesterol levels checked more often if: Your lipid or cholesterol levels are high. You are older than 70 years of age. You are at high risk for heart  disease. What should I know about cancer screening? Depending on your health history and family history, you may need to have cancer screening at various ages. This may include screening for: Breast cancer. Cervical cancer. Colorectal cancer. Skin cancer. Lung cancer. What should I know about heart disease, diabetes, and high blood pressure? Blood pressure and heart disease High blood pressure causes heart disease and increases the risk of stroke. This is more likely to develop in people who have high blood pressure readings, are of African descent, or are overweight. Have your blood pressure checked: Every 3-5 years if you are 18-39 years of age. Every year if you are 40 years old or older. Diabetes Have regular diabetes screenings. This checks your fasting blood sugar level. Have the screening done: Once every three years after age 40 if you are at a normal weight and have a low risk for diabetes. More often and at a younger age if you are overweight or have a high risk for diabetes. What should I know about preventing infection? Hepatitis B If you have a higher risk for hepatitis B, you should be screened for this virus. Talk with your health care provider to find out if you are at risk for hepatitis B infection. Hepatitis C Testing is recommended for: Everyone born from 1945 through 1965. Anyone with known risk factors for hepatitis C. Sexually transmitted infections (STIs) Get screened for STIs, including gonorrhea and chlamydia, if: You are sexually active and are younger than 70 years of age. You are older than 70 years of age and your   health care provider tells you that you are at risk for this type of infection. Your sexual activity has changed since you were last screened, and you are at increased risk for chlamydia or gonorrhea. Ask your health care provider if you are at risk. Ask your health care provider about whether you are at high risk for HIV. Your health care provider  may recommend a prescription medicine to help prevent HIV infection. If you choose to take medicine to prevent HIV, you should first get tested for HIV. You should then be tested every 3 months for as long as you are taking the medicine. Pregnancy If you are about to stop having your period (premenopausal) and you may become pregnant, seek counseling before you get pregnant. Take 400 to 800 micrograms (mcg) of folic acid every day if you become pregnant. Ask for birth control (contraception) if you want to prevent pregnancy. Osteoporosis and menopause Osteoporosis is a disease in which the bones lose minerals and strength with aging. This can result in bone fractures. If you are 65 years old or older, or if you are at risk for osteoporosis and fractures, ask your health care provider if you should: Be screened for bone loss. Take a calcium or vitamin D supplement to lower your risk of fractures. Be given hormone replacement therapy (HRT) to treat symptoms of menopause. Follow these instructions at home: Lifestyle Do not use any products that contain nicotine or tobacco, such as cigarettes, e-cigarettes, and chewing tobacco. If you need help quitting, ask your health care provider. Do not use street drugs. Do not share needles. Ask your health care provider for help if you need support or information about quitting drugs. Alcohol use Do not drink alcohol if: Your health care provider tells you not to drink. You are pregnant, may be pregnant, or are planning to become pregnant. If you drink alcohol: Limit how much you use to 0-1 drink a day. Limit intake if you are breastfeeding. Be aware of how much alcohol is in your drink. In the [place] one drink equals one 12 oz bottle of beer (355 mL), one 5 oz glass of wine (148 mL), or one 1 oz glass of hard liquor (44 mL). General instructions Schedule regular health, dental, and eye exams. Stay current with your vaccines. Tell your health care  provider if: You often feel depressed. You have ever been abused or do not feel safe at home. Summary Adopting a healthy lifestyle and getting preventive care are important in promoting health and wellness. Follow your health care provider's instructions about healthy diet, exercising, and getting tested or screened for diseases. Follow your health care provider's instructions on monitoring your cholesterol and blood pressure. This information is not intended to replace advice given to you by your health care provider. Make sure you discuss any questions you have with your health care provider. Document Revised: 06/09/2020 Document Reviewed: 03/25/2018 Elsevier Patient Education  2022 Elsevier Inc.  

## 2021-01-08 NOTE — Progress Notes (Signed)
This visit occurred during the SARS-CoV-2 public health emergency.  Safety protocols were in place, including screening questions prior to the visit, additional usage of staff PPE, and extensive cleaning of exam room while observing appropriate contact time as indicated for disinfecting solutions.    Patient ID: Sheri Kelly, female  DOB: 1951-03-11, 70 y.o.   MRN: [id number] Patient Care Team    Relationship Specialty Notifications Start End  Ma Hillock, DO PCP - General Family Medicine  09/25/16   Everett Graff, MD Consulting Physician Obstetrics and Gynecology  09/25/16   Laurence Aly, OD  Optometry  09/25/16    Comment: [name]  Gaynelle Arabian, MD Consulting Physician Orthopedic Surgery  09/26/17   Carol Ada, MD Consulting Physician Gastroenterology  07/14/20     Chief Complaint  Patient presents with   Annual Exam    Pt is not fasting    Subjective: Sheri Kelly is a 70 y.o.  Female  present for CPE  All past medical history, surgical history, allergies, family history, immunizations, medications and social history were updated in the electronic medical record today. All recent labs, ED visits and hospitalizations within the last year were reviewed.  Health maintenance:  Colonoscopy: no fhx, Dr.Hung, 11/09/2019, rpt 7 yrs> then called back and told her 3 yrs per pt., diverticulosis present. Mammogram (50-74): Fhx present in mother, mammogram 03/2018, normal.> she has scheduled for next month  Cervical cancer screening(<65): Pt has a hystrectomy and follows with GYN.  Immunizations: tdap 09/2016.  PNA series completed.  Flu shot UTD today.  Shingrix #1 completed 11/29/2020. Covid x4. Discussed booster.  Infectious disease screening: Hep C completed Dexa: 11/03/2020> NORMAL. No further testing recommnedned.  Assistive device: none Oxygen NWG:NFAO Patient has a Dental home. Hospitalizations/ED visits: reviewed  Depression screen [facility] 2/9 01/08/2021  05/31/2020 10/02/2018 12/03/2017 09/26/2017  Decreased Interest 0 0 0 0 0  Down, Depressed, Hopeless 0 1 0 - 0  PHQ - 2 Score 0 1 0 0 0   No flowsheet data found.   Immunization History  Administered Date(s) Administered   Fluad Quad(high Dose 65+) 01/08/2021   Influenza, High Dose Seasonal PF 02/24/2018   Influenza,inj,quad, With Preservative 11/13/2016   Influenza-Unspecified 12/15/2018   PFIZER(Purple Top)SARS-COV-2 Vaccination 05/07/2019, 05/28/2019, 12/14/2019, 02/24/2020   Pneumococcal Conjugate-13 09/25/2016   Pneumococcal Polysaccharide-23 10/02/2018   Zoster Recombinat (Shingrix) 11/29/2020     Past Medical History:  Diagnosis Date   Anemia 07/01/2019   Arthritis    Cervical dysplasia    Cervical polyp 13/0/8657   Complication of anesthesia    had ileus after hysterectomy 2011   History of breast reconstruction    Bilateral    History of chicken pox    History of measles, mumps, or rubella    Infertility, female    Menopausal symptoms    Trigger finger of all digits of right hand    RMF   Uterine fibroid    Allergies  Allergen Reactions   Dust Mite Extract    Molds & Smuts    Past Surgical History:  Procedure Laterality Date   ABDOMINAL HYSTERECTOMY  09/19/2009-09/22/2009   total   BREAST SURGERY  1998   "breast lift"   CESAREAN SECTION      x 2   INTRAOCULAR LENS INSERTION Bilateral    MOUTH SURGERY     SALPINGOOPHORECTOMY  09/19/2009   right   TRIGGER FINGER RELEASE Right 05/12/2017   Procedure: RIGHT TRIGGER FINGER RELEASE;  Surgeon:  Cristine Polio, MD;  Location: [facility];  Service: Plastics;  Laterality: Right;   TUBAL LIGATION     bilateral   UTERINE FIBROID SURGERY     Family History  Problem Relation Age of Onset   Arthritis Mother    Breast cancer Mother    Lung cancer Father    Hypertension Brother    Throat cancer Brother    Leukemia Sister    Hypertension Son    Social History   Social History Narrative    Divorced. 1 child ([name]).   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    Drinks caffeine, uses herbal remedies. Takes a daily vitamin.    Wears seatbelt, bicycle helmet and dentures.    Smoke detector in the home. Firearms locked in the home.    Feels safe in her relationships.     Allergies as of 01/08/2021       Reactions   Dust Mite Extract    Molds & Smuts         Medication List        Accurate as of January 08, 2021  1:36 PM. If you have any questions, ask your nurse or doctor.          STOP taking these medications    mometasone 0.1 % lotion Commonly known as: ELOCON Stopped by: Howard Pouch, DO       TAKE these medications    glucosamine-chondroitin 500-400 MG tablet Take 1 tablet by mouth 3 (three) times daily.   multivitamin tablet Take 1 tablet by mouth daily.        All past medical history, surgical history, allergies, family history, immunizations andmedications were updated in the EMR today and reviewed under the history and medication portions of their EMR.     No results found for this or any previous visit (from the past 2160 hour(s)).  MM DIAG BREAST TOMO UNI RIGHT Result Date: 11/18/2020 FINDINGS: RIGHT breast diagnostic mammogram: On today's additional diagnostic views, including spot compression with 3D tomosynthesis, the questioned mass within the inner RIGHT breast is not reproduced suggesting superimposition of normal dense fibroglandular tissues. Due to the heterogeneously dense breast tissues, ultrasound will be performed to ensure benignity. Targeted ultrasound is performed, evaluating the inner RIGHT breast, showing only normal fibroglandular tissues and fat lobules throughout. No solid or cystic mass. IMPRESSION: No evidence of malignancy. Patient may return to routine annual bilateral screening mammogram schedule. RECOMMENDATION: Screening mammogram in one year.(Code:[id number]) I have discussed the findings and recommendations with the patient.  If applicable, a reminder letter will be sent to the patient regarding the next appointment. BI-RADS CATEGORY  1: Negative. Electronically Signed   By: Franki Cabot M.D.   On: 11/18/2020 11:26     ROS: 14 pt review of systems performed and negative (unless mentioned in an HPI)  Objective: BP (!) 89/53   Pulse (!) 58   Temp 97.7 F (36.5 C) (Oral)   Ht 5' 7.75" (1.721 m)   Wt 170 lb (77.1 kg)   SpO2 99%   BMI 26.04 kg/m  Gen: Afebrile. No acute distress. Nontoxic in appearance, well-developed, well-nourished,  very pleasant female.  HENT: AT. [place]. Bilateral TM visualized and normal in appearance, normal external auditory canal. MMM, no oral lesions, adequate dentition. Bilateral nares within normal limits. Throat without erythema, ulcerations or exudates. no Cough on exam, no hoarseness on exam. Eyes:Pupils Equal Round Reactive to light, Extraocular movements intact,  Conjunctiva without redness, discharge or  icterus. Neck/lymp/endocrine: Supple,no lymphadenopathy, no thyromegaly CV: RRR no murmur, no edema, +2/4 P posterior tibialis pulses.  Chest: CTAB, no wheeze, rhonchi or crackles. normal Respiratory effort. good Air movement. Abd: Soft. flat. NTND. BS present. no Masses palpated. No hepatosplenomegaly. No rebound tenderness or guarding. Skin: no rashes, purpura or petechiae. Warm and well-perfused. Skin intact. Neuro/Msk:  Normal gait. PERLA. EOMi. Alert. Oriented x3.  Cranial nerves II through XII intact. Muscle strength 5/5 upper/lower extremity. DTRs equal bilaterally. Psych: Normal affect, dress and demeanor. Normal speech. Normal thought content and judgment.  No results found.  Assessment/plan: Anjelique Makar is a 70 y.o. female present for CPE Need for influenza vaccination - Flu Vaccine QUAD High Dose(Fluad) Screening for deficiency anemia - CBC with Differential/Platelet Lipid screening - Lipid panel Diabetes mellitus screening - Comprehensive metabolic panel -  Hemoglobin A1c  Routine general medical examination at a health care facility Colonoscopy: no fhx, Dr.Hung, 11/09/2019, rpt 7 yrs> then called back and told her 3 yrs per pt., diverticulosis present. Mammogram (50-74): Fhx present in mother, mammogram 03/2018, normal.> she has scheduled for next month  Cervical cancer screening(<65): Pt has a hystrectomy and follows with GYN.  Immunizations: tdap 09/2016.  PNA series completed.  Flu shot UTD today.  Shingrix #1 completed 11/29/2020. Covid x4. Discussed booster.  Infectious disease screening: Hep C completed Dexa: 11/03/2020> NORMAL. No further testing recommnedned.  Patient was encouraged to exercise greater than 150 minutes a week. Patient was encouraged to choose a diet filled with fresh fruits and vegetables, and lean meats. AVS provided to patient today for education/recommendation on gender specific health and safety maintenance.  Return in about 1 year (around 01/09/2022) for CPE (30 min).   Orders Placed This Encounter  Procedures   Flu Vaccine QUAD High Dose(Fluad)   CBC with Differential/Platelet   Comprehensive metabolic panel   Hemoglobin A1c   Lipid panel   No orders of the defined types were placed in this encounter.  Referral Orders  No referral(s) requested today     Electronically signed by: Howard Pouch, [place]

## 2021-02-08 DIAGNOSIS — N3941 Urge incontinence: Secondary | ICD-10-CM | POA: Diagnosis not present

## 2021-02-08 DIAGNOSIS — Z0000 Encounter for general adult medical examination without abnormal findings: Secondary | ICD-10-CM | POA: Diagnosis not present

## 2021-02-08 DIAGNOSIS — Z008 Encounter for other general examination: Secondary | ICD-10-CM | POA: Diagnosis not present

## 2021-02-08 DIAGNOSIS — E663 Overweight: Secondary | ICD-10-CM | POA: Diagnosis not present

## 2021-02-08 DIAGNOSIS — Z6826 Body mass index (BMI) 26.0-26.9, adult: Secondary | ICD-10-CM | POA: Diagnosis not present

## 2021-06-06 ENCOUNTER — Ambulatory Visit: Payer: Medicare HMO

## 2021-06-25 NOTE — Progress Notes (Signed)
This encounter was created in error - please disregard.

## 2021-07-05 ENCOUNTER — Other Ambulatory Visit: Payer: Self-pay

## 2021-07-05 ENCOUNTER — Ambulatory Visit (INDEPENDENT_AMBULATORY_CARE_PROVIDER_SITE_OTHER): Payer: Medicare HMO

## 2021-07-05 DIAGNOSIS — Z0000 Encounter for general adult medical examination without abnormal findings: Secondary | ICD-10-CM | POA: Diagnosis not present

## 2021-07-05 MED ORDER — ZOSTER VAC RECOMB ADJUVANTED 50 MCG/0.5ML IM SUSR: 0.5000 mL | Freq: Once | INTRAMUSCULAR | 0 refills | Status: AC

## 2021-07-05 NOTE — Patient Instructions (Signed)

## 2021-07-05 NOTE — Progress Notes (Signed)
? ?Subjective:  ? Sheri Kelly is a 71 y.o. female who presents for Medicare Annual (Subsequent) preventive examination. ? ?Review of Systems    ?I connected with  Sheri Kelly on 07/05/21 by an audio only telemedicine application and verified that I am speaking with the correct person using two identifiers. ?  ?I discussed the limitations, risks, security and privacy concerns of performing an evaluation and management service by telephone and the availability of in person appointments. I also discussed with the patient that there may be a patient responsible charge related to this service. The patient expressed understanding and verbally consented to this telephonic visit. ? ?Location of Patient: home ?Location of Provider:office ? ?List any persons and their role that are participating in the visit with the patient.  ? ?Sheri Kelly  ?[facility] ? ?Cardiac Risk Factors include: none ? ?   ?Objective:  ?  ?There were no vitals filed for this visit. ?There is no height or weight on file to calculate BMI. ? ? ?  07/05/2021  ?  2:03 PM 05/31/2020  ? 11:21 AM 10/02/2018  ?  1:05 PM 09/26/2017  ? 10:23 AM 05/12/2017  ?  8:40 AM 05/06/2017  ?  2:08 PM  ?Advanced Directives  ?Does Patient Have a Medical Advance Directive? Yes Yes Yes Yes Yes Yes  ?Type of Advance Directive Living will;Healthcare Power of [place];Living will [place];Living will Living will;Healthcare Power of Attorney  Living will;Healthcare Power of Attorney  ?Does patient want to make changes to medical advance directive? No - Patient declined    No - Patient declined   ?Copy of [place] in Chart?  No - copy requested No - copy requested No - copy requested    ? ? ?Current Medications (verified) ?Outpatient Encounter Medications as of 07/05/2021  ?Medication Sig  ? Zoster Vaccine Adjuvanted [facility]) injection Inject 0.5 mLs into the muscle once for 1 dose.  ? glucosamine-chondroitin  500-400 MG tablet Take 1 tablet by mouth 3 (three) times daily.  ? Multiple Vitamin (MULTIVITAMIN) tablet Take 1 tablet by mouth daily.  ? ?No facility-administered encounter medications on file as of 07/05/2021.  ? ? ?Allergies (verified) ?Dust mite extract and Molds & smuts  ? ?History: ?Past Medical History:  ?Diagnosis Date  ? Anemia 07/01/2019  ? Arthritis   ? Cervical dysplasia   ? Cervical polyp 01/14/2008  ? Complication of anesthesia   ? had ileus after hysterectomy 2011  ? History of breast reconstruction   ? Bilateral   ? History of chicken pox   ? History of measles, mumps, or rubella   ? Infertility, female   ? Menopausal symptoms   ? Trigger finger of all digits of right hand   ? RMF  ? Uterine fibroid   ? ?Past Surgical History:  ?Procedure Laterality Date  ? ABDOMINAL HYSTERECTOMY  09/19/2009-09/22/2009  ? total  ? BREAST SURGERY  1998  ? "breast lift"  ? CESAREAN SECTION    ?  x 2  ? INTRAOCULAR LENS INSERTION Bilateral   ? MOUTH SURGERY    ? SALPINGOOPHORECTOMY  09/19/2009  ? right  ? TRIGGER FINGER RELEASE Right 05/12/2017  ? Procedure: RIGHT TRIGGER FINGER RELEASE;  Surgeon: Cristine Polio, MD;  Location: [facility];  Service: Plastics;  Laterality: Right;  ? TUBAL LIGATION    ? bilateral  ? UTERINE FIBROID SURGERY    ? ?Family History  ?Problem Relation Age  of Onset  ? Arthritis Mother   ? Breast cancer Mother   ? Lung cancer Father   ? Hypertension Brother   ? Throat cancer Brother   ? Leukemia Sister   ? Hypertension Son   ? ?Social History  ? ?Socioeconomic History  ? Marital status: Divorced  ?  Spouse name: Not on file  ? Number of children: 1  ? Years of education: 11  ? Highest education level: Not on file  ?Occupational History  ? Occupation: Armed forces operational officer- [employer]  ?Tobacco Use  ? Smoking status: Former  ?  Types: Cigarettes  ?  Quit date: 09/11/2009  ?  Years since quitting: 11.8  ? Smokeless tobacco: Never  ? Tobacco comments:  ?  socially only for a few years   ?Vaping Use  ? Vaping Use: Never used  ?Substance and Sexual Activity  ? Alcohol use: Yes  ?  Alcohol/week: 5.0 standard drinks  ?  Types: 5 Glasses of wine per week  ?  Comment: social  ? Drug use: No  ? Sexual activity: Never  ?Other Topics Concern  ? Not on file  ?Social History Narrative  ? Divorced. 1 child ([name]).  ? 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   ? Drinks caffeine, uses herbal remedies. Takes a daily vitamin.   ? Wears seatbelt, bicycle helmet and dentures.   ? Smoke detector in the home. Firearms locked in the home.   ? Feels safe in her relationships.   ? ?Social Determinants of Health  ? ?Financial Resource Strain: Low Risk   ? Difficulty of Paying Living Expenses: Not hard at all  ?Food Insecurity: No Food Insecurity  ? Worried About Charity fundraiser in the Last Year: Never true  ? Ran Out of Food in the Last Year: Never true  ?Transportation Needs: No Transportation Needs  ? Lack of Transportation (Medical): No  ? Lack of Transportation (Non-Medical): No  ?Physical Activity: Insufficiently Active  ? Days of Exercise per Week: 2 days  ? Minutes of Exercise per Session: 60 min  ?Stress: No Stress Concern Present  ? Feeling of Stress : Only a little  ?Social Connections: Moderately Isolated  ? Frequency of Communication with Friends and Family: More than three times a week  ? Frequency of Social Gatherings with Friends and Family: More than three times a week  ? Attends Religious Services: More than 4 times per year  ? Active Member of Clubs or Organizations: No  ? Attends Archivist Meetings: Never  ? Marital Status: Divorced  ? ? ?Tobacco Counseling ?Counseling given: Not Answered ?Tobacco comments: socially only for a few years ? ? ?Clinical Intake: ? ?Pre-visit preparation completed: No ? ?Pain : No/denies pain ? ?  ? ?Nutritional Risks: None ?Diabetes: No ? ?How often do you need to have someone help you when you read instructions, pamphlets, or other written materials from your doctor  or pharmacy?: 1 - Never ? ?Diabetic?no ? ?Interpreter Needed?: No ? ?  ? ? ?Activities of Daily Living ? ?  07/05/2021  ?  2:02 PM  ?In your present state of health, do you have any difficulty performing the following activities:  ?Hearing? 0  ?Vision? 0  ?Difficulty concentrating or making decisions? 0  ?Walking or climbing stairs? 0  ?Dressing or bathing? 0  ?Doing errands, shopping? 0  ?Preparing Food and eating ? N  ?Using the Toilet? N  ?In the past six months, have you  accidently leaked urine? N  ?Do you have problems with loss of bowel control? N  ?Managing your Medications? N  ?Managing your Finances? N  ?Housekeeping or managing your Housekeeping? N  ? ? ?Patient Care Team: ?Ma Hillock, DO as PCP - General (Family Medicine) ?Everett Graff, MD as Consulting Physician (Obstetrics and Gynecology) ?Laurence Aly, [place] (Optometry) ?Gaynelle Arabian, MD as Consulting Physician (Orthopedic Surgery) ?Carol Ada, MD as Consulting Physician (Gastroenterology) ? ?Indicate any recent Medical Services you may have received from other than [facility] providers in the past year (date may be approximate). ? ?   ?Assessment:  ? This is a routine wellness examination for Sheri. ? ?Hearing/Vision screen ?No results found. ? ?Dietary issues and exercise activities discussed: ?Current Exercise Habits: Structured exercise class, Frequency (Times/Week): 2 ? ? Goals Addressed   ?None ?  ?Depression Screen ? ?  07/05/2021  ?  2:02 PM 01/08/2021  ?  1:10 PM 05/31/2020  ? 11:23 AM 10/02/2018  ?  1:13 PM 12/03/2017  ? 10:21 AM 09/26/2017  ? 10:24 AM 09/25/2016  ?  9:10 AM  ?PHQ 2/9 Scores  ?PHQ - 2 Score 0 0 1 0 0 0 0  ?  ?Fall Risk ? ?  07/05/2021  ?  2:03 PM 01/08/2021  ?  1:10 PM 05/31/2020  ? 11:22 AM 10/02/2018  ?  1:09 PM 12/03/2017  ? 10:21 AM  ?Fall Risk   ?Falls in the past year? 0 0 0 0 No  ?Number falls in past yr: 0 0 0    ?Injury with Fall? 0 0 0    ?Follow up  Falls evaluation completed Falls prevention discussed Education  provided;Falls evaluation completed;Falls prevention discussed   ? ? ?FALL RISK PREVENTION PERTAINING TO THE HOME: ? ?Any stairs in or around the home? Yes  ?If so, are there any without handrails? Yes  ?Home free of lo

## 2021-07-06 DIAGNOSIS — Z0100 Encounter for examination of eyes and vision without abnormal findings: Secondary | ICD-10-CM | POA: Diagnosis not present

## 2021-07-06 DIAGNOSIS — H5200 Hypermetropia, unspecified eye: Secondary | ICD-10-CM | POA: Diagnosis not present

## 2021-08-09 DIAGNOSIS — M25512 Pain in left shoulder: Secondary | ICD-10-CM | POA: Diagnosis not present

## 2021-10-17 ENCOUNTER — Other Ambulatory Visit: Payer: Self-pay | Admitting: Family Medicine

## 2021-10-17 DIAGNOSIS — Z1231 Encounter for screening mammogram for malignant neoplasm of breast: Secondary | ICD-10-CM

## 2021-11-19 ENCOUNTER — Ambulatory Visit
Admission: RE | Admit: 2021-11-19 | Discharge: 2021-11-19 | Disposition: A | Discharge status: Home / Self Care | Payer: Medicare HMO | Source: Ambulatory Visit | Attending: Family Medicine | Admitting: Family Medicine

## 2021-11-19 ENCOUNTER — Ambulatory Visit: Payer: Medicare HMO

## 2021-11-19 DIAGNOSIS — Z1231 Encounter for screening mammogram for malignant neoplasm of breast: Secondary | ICD-10-CM | POA: Diagnosis not present

## 2021-11-30 ENCOUNTER — Encounter: Payer: Self-pay | Admitting: Family Medicine

## 2021-11-30 ENCOUNTER — Ambulatory Visit (INDEPENDENT_AMBULATORY_CARE_PROVIDER_SITE_OTHER): Payer: Medicare HMO | Admitting: Family Medicine

## 2021-11-30 VITALS — BP 110/67 | HR 63 | Temp 98.0°F | Ht 67.75 in | Wt 165.0 lb

## 2021-11-30 DIAGNOSIS — R413 Other amnesia: Secondary | ICD-10-CM | POA: Diagnosis not present

## 2021-11-30 DIAGNOSIS — R42 Dizziness and giddiness: Secondary | ICD-10-CM | POA: Diagnosis not present

## 2021-11-30 LAB — COMPREHENSIVE METABOLIC PANEL
ALT: 13 U/L (ref 0–35)
AST: 19 U/L (ref 0–37)
Albumin: 4.4 g/dL (ref 3.5–5.2)
Alkaline Phosphatase: 56 U/L (ref 39–117)
BUN: 21 mg/dL (ref 6–23)
CO2: 30 mEq/L (ref 19–32)
Calcium: 10 mg/dL (ref 8.4–10.5)
Chloride: 105 mEq/L (ref 96–112)
Creatinine, Ser: 0.81 mg/dL (ref 0.40–1.20)
GFR: 73.07 mL/min (ref >60.00)
Glucose, Bld: 96 mg/dL (ref 70–99)
Potassium: 4.6 mEq/L (ref 3.5–5.1)
Sodium: 141 mEq/L (ref 135–145)
Total Bilirubin: 0.6 mg/dL (ref 0.2–1.2)
Total Protein: 6.9 g/dL (ref 6.0–8.3)

## 2021-11-30 LAB — VITAMIN B12: Vitamin B-12: 390 pg/mL (ref 211–911)

## 2021-11-30 LAB — CBC WITH DIFFERENTIAL/PLATELET
Basophils Absolute: 0 10*3/uL (ref 0.0–0.1)
Basophils Relative: 0.3 % (ref 0.0–3.0)
Eosinophils Absolute: 0 10*3/uL (ref 0.0–0.7)
Eosinophils Relative: 0.7 % (ref 0.0–5.0)
HCT: 37.4 % (ref 36.0–46.0)
Hemoglobin: 12.2 g/dL (ref 12.0–15.0)
Lymphocytes Relative: 31.8 % (ref 12.0–46.0)
Lymphs Abs: 1 10*3/uL (ref 0.7–4.0)
MCHC: 32.6 g/dL (ref 30.0–36.0)
MCV: 84.9 fl (ref 78.0–100.0)
Monocytes Absolute: 0.3 10*3/uL (ref 0.1–1.0)
Monocytes Relative: 9.5 % (ref 3.0–12.0)
Neutro Abs: 1.9 10*3/uL (ref 1.4–7.7)
Neutrophils Relative %: 57.7 % (ref 43.0–77.0)
Platelets: 259 10*3/uL (ref 150.0–400.0)
RBC: 4.41 Mil/uL (ref 3.87–5.11)
RDW: 14.8 % (ref 11.5–15.5)
WBC: 3.2 10*3/uL — ABNORMAL LOW (ref 4.0–10.5)

## 2021-11-30 LAB — VITAMIN D 25 HYDROXY (VIT D DEFICIENCY, FRACTURES): VITD: 23.03 ng/mL — ABNORMAL LOW (ref 30.00–100.00)

## 2021-11-30 LAB — TSH: TSH: 1.15 u[IU]/mL (ref 0.35–5.50)

## 2021-11-30 NOTE — Progress Notes (Signed)
[place] , [birth year], 71 y.o., female MRN: [id number] Patient Care Team    Relationship Specialty Notifications Start End  Ma Hillock, DO PCP - General Family Medicine  09/25/16   Everett Graff, MD Consulting Physician Obstetrics and Gynecology  09/25/16   Laurence Aly, OD  Optometry  09/25/16    Comment: [name]  Gaynelle Arabian, MD Consulting Physician Orthopedic Surgery  09/26/17   Carol Ada, MD Consulting Physician Gastroenterology  07/14/20     Chief Complaint  Patient presents with   Dizziness    Pt c/o dizziness, memory changes, x 1.5 mos; denies nausea      Subjective: Pt presents for an OV with complaints of dizziness when bending over and laying flat (mostly). Dizziness also has occurred when walking. She has also noted some memory changes. She reports she frequently forgets "what I did yesterday." She has noticed both sx onset over 1.5 months.      07/05/2021    2:02 PM 01/08/2021    1:10 PM 05/31/2020   11:23 AM 10/02/2018    1:13 PM 12/03/2017   10:21 AM  Depression screen PHQ 2/9  Decreased Interest 0 0 0 0 0  Down, Depressed, Hopeless 0 0 1 0   PHQ - 2 Score 0 0 1 0 0    Allergies  Allergen Reactions   Dust Mite Extract    Molds & Smuts    Social History   Social History Narrative   Divorced. 1 child ([name]).   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    Drinks caffeine, uses herbal remedies. Takes a daily vitamin.    Wears seatbelt, bicycle helmet and dentures.    Smoke detector in the home. Firearms locked in the home.    Feels safe in her relationships.    Past Medical History:  Diagnosis Date   Anemia 07/01/2019   Arthritis    Cervical dysplasia    Cervical polyp 17/08/1023   Complication of anesthesia    had ileus after hysterectomy 2011   History of breast reconstruction    Bilateral    History of chicken pox    History of measles, mumps, or rubella    Infertility, female    Menopausal symptoms    Trigger finger of  all digits of right hand    RMF   Uterine fibroid    Past Surgical History:  Procedure Laterality Date   ABDOMINAL HYSTERECTOMY  09/19/2009-09/22/2009   total   BREAST SURGERY  1998   "breast lift"   CESAREAN SECTION      x 2   INTRAOCULAR LENS INSERTION Bilateral    MOUTH SURGERY     SALPINGOOPHORECTOMY  09/19/2009   right   TRIGGER FINGER RELEASE Right 05/12/2017   Procedure: RIGHT TRIGGER FINGER RELEASE;  Surgeon: Cristine Polio, MD;  Location: [facility];  Service: Plastics;  Laterality: Right;   TUBAL LIGATION     bilateral   UTERINE FIBROID SURGERY     Family History  Problem Relation Age of Onset   Arthritis Mother    Breast cancer Mother    Lung cancer Father    Hypertension Brother    Throat cancer Brother    Leukemia Sister    Hypertension Son    Allergies as of 11/30/2021       Reactions   Dust Mite Extract    Molds & Smuts         Medication List  Accurate as of November 30, 2021  8:53 AM. If you have any questions, ask your nurse or doctor.          glucosamine-chondroitin 500-400 MG tablet Take 1 tablet by mouth 3 (three) times daily.   multivitamin tablet Take 1 tablet by mouth daily.        All past medical history, surgical history, allergies, family history, immunizations andmedications were updated in the EMR today and reviewed under the history and medication portions of their EMR.     Review of Systems  Constitutional:  Negative for chills, diaphoresis, fever, malaise/fatigue and weight loss.  Eyes:  Negative for double vision.  Respiratory:  Negative for shortness of breath.   Cardiovascular:  Negative for chest pain, palpitations and leg swelling.  Gastrointestinal:  Negative for constipation, diarrhea, nausea and vomiting.  Genitourinary:  Negative for dysuria, frequency and urgency.  Skin:  Negative for rash.  Neurological:  Positive for dizziness. Negative for headaches.   Negative, with the exception of  above mentioned in HPI   Objective:  BP 110/67 (Patient Position: Standing)   Pulse 63   Temp 98 F (36.7 C) (Oral)   Ht 5' 7.75" (1.721 m)   Wt 165 lb (74.8 kg)   SpO2 100%   BMI 25.27 kg/m  Body mass index is 25.27 kg/m. Physical Exam Vitals and nursing note reviewed.  Constitutional:      General: She is not in acute distress.    Appearance: Normal appearance. She is not ill-appearing, toxic-appearing or diaphoretic.  HENT:     Head: Normocephalic and atraumatic.     Mouth/Throat:     Mouth: Mucous membranes are moist.  Eyes:     General: No scleral icterus.       Right eye: No discharge.        Left eye: No discharge.     Extraocular Movements: Extraocular movements intact.     Conjunctiva/sclera: Conjunctivae normal.     Pupils: Pupils are equal, round, and reactive to light.  Cardiovascular:     Rate and Rhythm: Regular rhythm. Bradycardia present.     Heart sounds: No murmur heard. Pulmonary:     Effort: Pulmonary effort is normal. No respiratory distress.     Breath sounds: Normal breath sounds. No wheezing, rhonchi or rales.  Musculoskeletal:     Cervical back: Neck supple. No tenderness.     Right lower leg: No edema.     Left lower leg: No edema.  Lymphadenopathy:     Cervical: No cervical adenopathy.  Skin:    General: Skin is warm and dry.     Coloration: Skin is not jaundiced or pale.     Findings: No erythema or rash.  Neurological:     Mental Status: She is alert and oriented to person, place, and time. Mental status is at baseline.     Motor: No weakness.     Gait: Gait normal.  Psychiatric:        Mood and Affect: Mood normal.        Behavior: Behavior normal.        Thought Content: Thought content normal.        Judgment: Judgment normal.      No results found. No results found. No results found for this or any previous visit (from the past 24 hour(s)).  Assessment/Plan: Sheri Kelly is a 71 y.o. female present for OV for  Memory  changes/Dizziness Encouraged her to hydrate. Dizziness could be mild  dehydration, she works out in the yard frequently.  Will rule out infectious, endocrine, electrolyte, vitamin def or anemia as cause.  - Comp Met (CMET) - CBC w/Diff - TSH - B12 - Vitamin D (25 hydroxy) - Urinalysis w microscopic + reflex cultur If labs due not support a reversible cause would refer to cardio for the brady and dizziness.    Reviewed expectations re: course of current medical issues. Discussed self-management of symptoms. Outlined signs and symptoms indicating need for more acute intervention. Patient verbalized understanding and all questions were answered. Patient received an After-Visit Summary.    No orders of the defined types were placed in this encounter.  No orders of the defined types were placed in this encounter.  Referral Orders  No referral(s) requested today     Note is dictated utilizing voice recognition software. Although note has been proof read prior to signing, occasional typographical errors still can be missed. If any questions arise, please do not hesitate to call for verification.   electronically signed by:  Howard Pouch, DO  [place]

## 2021-11-30 NOTE — Patient Instructions (Signed)
Dizziness Dizziness is a common problem. It makes you feel unsteady or light-headed. You may feel like you are about to pass out (faint). Dizziness can lead to getting hurt if you stumble or fall. Dizziness can be caused by many things, including: Medicines. Not having enough water in your body (dehydration). Illness. Follow these instructions at home: Eating and drinking  Drink enough fluid to keep your pee (urine) pale yellow. This helps to keep you from getting dehydrated. Try to drink more clear fluids, such as water. Do not drink alcohol. Limit how much caffeine you drink or eat, if your doctor tells you to do that. Limit how much salt (sodium) you drink or eat, if your doctor tells you to do that. Activity  Avoid making quick movements. Stand up slowly from sitting in a chair, and steady yourself until you feel okay. In the morning, first sit up on the side of the bed. When you feel okay, stand up slowly while you hold onto something. Do this until you know that your balance is okay. If you need to stand in one place for a long time, move your legs often. Tighten and relax the muscles in your legs while you are standing. Do not drive or use machinery if you feel dizzy. Avoid bending down if you feel dizzy. Place items in your home so you can reach them easily without leaning over. Lifestyle Do not smoke or use any products that contain nicotine or tobacco. If you need help quitting, ask your doctor. Try to lower your stress level. You can do this by using methods such as yoga or meditation. Talk with your doctor if you need help. General instructions Watch your dizziness for any changes. Take over-the-counter and prescription medicines only as told by your doctor. Talk with your doctor if you think that you are dizzy because of a medicine that you are taking. Tell a friend or a family member that you are feeling dizzy. If he or she notices any changes in your behavior, have this  person call your doctor. Keep all follow-up visits. Contact a doctor if: Your dizziness does not go away. Your dizziness or light-headedness gets worse. You feel like you may vomit (are nauseous). You have trouble hearing. You have new symptoms. You are unsteady on your feet. You feel like the room is spinning. You have neck pain or a stiff neck. You have a fever. Get help right away if: You vomit or have watery poop (diarrhea), and you cannot eat or drink anything. You have trouble: Talking. Walking. Swallowing. Using your arms, hands, or legs. You feel generally weak. You are not thinking clearly, or you have trouble forming sentences. A friend or family member may notice this. You have: Chest pain. Pain in your belly (abdomen). Shortness of breath. Sweating. Your vision changes. You are bleeding. You have a very bad headache. These symptoms may be an emergency. Get help right away. Call your local emergency services (911 in the [place] Do not wait to see if the symptoms will go away. Do not drive yourself to the hospital. Summary Dizziness makes you feel unsteady or light-headed. You may feel like you are about to pass out (faint). Drink enough fluid to keep your pee (urine) pale yellow. Do not drink alcohol. Avoid making quick movements if you feel dizzy. Watch your dizziness for any changes. This information is not intended to replace advice given to you by your health care provider. Make sure you discuss any questions   you have with your health care provider. Document Revised: 03/06/2020 Document Reviewed: 03/06/2020 Elsevier Patient Education  2023 Elsevier Inc.  

## 2021-12-03 ENCOUNTER — Telehealth: Payer: Self-pay | Admitting: Family Medicine

## 2021-12-03 DIAGNOSIS — E559 Vitamin D deficiency, unspecified: Secondary | ICD-10-CM | POA: Insufficient documentation

## 2021-12-03 LAB — URINALYSIS W MICROSCOPIC + REFLEX CULTURE
Bacteria, UA: NONE SEEN /HPF
Bilirubin Urine: NEGATIVE
Glucose, UA: NEGATIVE
Hgb urine dipstick: NEGATIVE
Hyaline Cast: NONE SEEN /LPF
Ketones, ur: NEGATIVE
Nitrites, Initial: NEGATIVE
Protein, ur: NEGATIVE
RBC / HPF: NONE SEEN /HPF (ref 0–2)
Specific Gravity, Urine: 1.029 (ref 1.001–1.035)
pH: 5.5 (ref 5.0–8.0)

## 2021-12-03 LAB — URINE CULTURE
MICRO NUMBER:: 13801696
Result:: NO GROWTH
SPECIMEN QUALITY:: ADEQUATE

## 2021-12-03 LAB — CULTURE INDICATED

## 2021-12-03 MED ORDER — VITAMIN D (ERGOCALCIFEROL) 1.25 MG (50000 UNIT) PO CAPS: 50000.0000 [IU] | ORAL_CAPSULE | ORAL | 0 refills | Status: DC

## 2021-12-03 NOTE — Telephone Encounter (Signed)
Please inform patient Her vitamin D levels are low.  I have called in a once weekly high-dose vitamin D supplement for her to take with food for 12 weeks.   -After prescribe supplement is completed, I would encourage her to add 1000 units of vitamin D3 OTC to her regimen to maintain levels.  Her B12 levels are also low normal.  Patient's can experience symptoms when B12 levels are below 400 and hers is 390.  I would encourage her to add over-the-counter B12 [phone] mcg sublingual daily to her regimen.  Blood cell counts are stable. Liver, kidney and thyroid functions are normal. Urinalysis is normal.   Follow-up in 8 weeks after starting supplements with provider.  We will recheck levels at that time and if not seeing improvement in her memory would move forward with further evaluation on her memory changes.

## 2021-12-03 NOTE — Telephone Encounter (Signed)
Attempted to contact pt re results. VM full did not leave VM.

## 2021-12-03 NOTE — Telephone Encounter (Signed)
Pt called back, but said she will check mychart for results. I informed patient VM was full as well.

## 2021-12-04 NOTE — Telephone Encounter (Signed)
That would be okay

## 2021-12-04 NOTE — Telephone Encounter (Signed)
Patient advised of recommendations.  

## 2021-12-04 NOTE — Telephone Encounter (Signed)
Spoke with pt regarding labs and instructions.   

## 2021-12-04 NOTE — Telephone Encounter (Signed)
Pt has upcoming CPE 09/27 would like to if she can r/s that appt and do all labs at the same time.

## 2021-12-29 DIAGNOSIS — Z87891 Personal history of nicotine dependence: Secondary | ICD-10-CM | POA: Diagnosis not present

## 2021-12-29 DIAGNOSIS — N3941 Urge incontinence: Secondary | ICD-10-CM | POA: Diagnosis not present

## 2021-12-29 DIAGNOSIS — R030 Elevated blood-pressure reading, without diagnosis of hypertension: Secondary | ICD-10-CM | POA: Diagnosis not present

## 2022-01-09 ENCOUNTER — Encounter: Payer: Self-pay | Admitting: Family Medicine

## 2022-01-09 ENCOUNTER — Ambulatory Visit (INDEPENDENT_AMBULATORY_CARE_PROVIDER_SITE_OTHER): Payer: Medicare HMO | Admitting: Family Medicine

## 2022-01-09 VITALS — BP 95/58 | HR 65 | Temp 98.1°F | Ht 67.5 in | Wt 163.0 lb

## 2022-01-09 DIAGNOSIS — R413 Other amnesia: Secondary | ICD-10-CM

## 2022-01-09 DIAGNOSIS — E559 Vitamin D deficiency, unspecified: Secondary | ICD-10-CM | POA: Diagnosis not present

## 2022-01-09 DIAGNOSIS — Z130 Encounter for screening for diseases of the blood and blood-forming organs and certain disorders involving the immune mechanism: Secondary | ICD-10-CM

## 2022-01-09 DIAGNOSIS — Z13220 Encounter for screening for lipoid disorders: Secondary | ICD-10-CM | POA: Diagnosis not present

## 2022-01-09 DIAGNOSIS — Z131 Encounter for screening for diabetes mellitus: Secondary | ICD-10-CM

## 2022-01-09 DIAGNOSIS — Z23 Encounter for immunization: Secondary | ICD-10-CM

## 2022-01-09 DIAGNOSIS — Z0000 Encounter for general adult medical examination without abnormal findings: Secondary | ICD-10-CM | POA: Diagnosis not present

## 2022-01-09 LAB — CBC
HCT: 36.3 % (ref 36.0–46.0)
Hemoglobin: 11.9 g/dL — ABNORMAL LOW (ref 12.0–15.0)
MCHC: 32.9 g/dL (ref 30.0–36.0)
MCV: 84.5 fl (ref 78.0–100.0)
Platelets: 259 10*3/uL (ref 150.0–400.0)
RBC: 4.29 Mil/uL (ref 3.87–5.11)
RDW: 14.9 % (ref 11.5–15.5)
WBC: 3.4 10*3/uL — ABNORMAL LOW (ref 4.0–10.5)

## 2022-01-09 LAB — LIPID PANEL
Cholesterol: 246 mg/dL — ABNORMAL HIGH (ref 0–200)
HDL: 115.7 mg/dL (ref >39.00)
LDL Cholesterol: 112 mg/dL — ABNORMAL HIGH (ref 0–99)
NonHDL: 130.51
Total CHOL/HDL Ratio: 2
Triglycerides: 92 mg/dL (ref 0.0–149.0)
VLDL: 18.4 mg/dL (ref 0.0–40.0)

## 2022-01-09 LAB — COMPREHENSIVE METABOLIC PANEL
ALT: 14 U/L (ref 0–35)
AST: 19 U/L (ref 0–37)
Albumin: 4.4 g/dL (ref 3.5–5.2)
Alkaline Phosphatase: 56 U/L (ref 39–117)
BUN: 19 mg/dL (ref 6–23)
CO2: 30 mEq/L (ref 19–32)
Calcium: 10.1 mg/dL (ref 8.4–10.5)
Chloride: 103 mEq/L (ref 96–112)
Creatinine, Ser: 1 mg/dL (ref 0.40–1.20)
GFR: 56.7 mL/min — ABNORMAL LOW (ref >60.00)
Glucose, Bld: 82 mg/dL (ref 70–99)
Potassium: 4.5 mEq/L (ref 3.5–5.1)
Sodium: 140 mEq/L (ref 135–145)
Total Bilirubin: 0.5 mg/dL (ref 0.2–1.2)
Total Protein: 7.2 g/dL (ref 6.0–8.3)

## 2022-01-09 LAB — HEMOGLOBIN A1C: Hgb A1c MFr Bld: 6 % (ref 4.6–6.5)

## 2022-01-09 LAB — TSH: TSH: 1.1 u[IU]/mL (ref 0.35–5.50)

## 2022-01-09 LAB — VITAMIN D 25 HYDROXY (VIT D DEFICIENCY, FRACTURES): VITD: 46.37 ng/mL (ref 30.00–100.00)

## 2022-01-09 NOTE — Progress Notes (Signed)
Patient ID: Sheri Kelly, female  DOB: September 21, 1950, 71 y.o.   MRN: [id number] Patient Care Team    Relationship Specialty Notifications Start End  Ma Hillock, DO PCP - General Family Medicine  09/25/16   Everett Graff, MD Consulting Physician Obstetrics and Gynecology  09/25/16   Laurence Aly, OD  Optometry  09/25/16    Comment: [name]  Gaynelle Arabian, MD Consulting Physician Orthopedic Surgery  09/26/17   Carol Ada, MD Consulting Physician Gastroenterology  07/14/20     Chief Complaint  Patient presents with   Annual Exam    Pt is fasting     Subjective:  Sheri Kelly is a 71 y.o.  Female  present for CPE  All past medical history, surgical history, allergies, family history, immunizations, medications and social history were updated in the electronic medical record today. All recent labs, ED visits and hospitalizations within the last year were reviewed.  Health maintenance:  Colonoscopy: no fhx, Dr.Hung, 11/09/2019, rpt 7 yrs> then called back and told her 3 yrs per pt., diverticulosis present. Mammogram (50-74): Fhx present in mother, mammogram 03/2018, normal.> 11/2021-[facility] Cervical cancer screening(<65): Pt has a hystrectomy and follows with GYN.  Immunizations: tdap 09/2016.  PNA series completed.  Flu shot completed today.  Shingrix completed. Covid x4. Discussed booster.  Infectious disease screening: Hep C completed Dexa: 11/03/2020> NORMAL. No further testing recommnedned.  Assistive device: none Oxygen HBZ:JIRC Patient has a Dental home. Hospitalizations/ED visits: reviewed      07/05/2021    2:02 PM 01/08/2021    1:10 PM 05/31/2020   11:23 AM 10/02/2018    1:13 PM 12/03/2017   10:21 AM  Depression screen PHQ 2/9  Decreased Interest 0 0 0 0 0  Down, Depressed, Hopeless 0 0 1 0   PHQ - 2 Score 0 0 1 0 0       No data to display          Immunization History  Administered Date(s) Administered   Fluad Quad(high Dose 65+)  01/08/2021, 01/09/2022   Influenza, High Dose Seasonal PF 02/24/2018   Influenza,inj,quad, With Preservative 11/13/2016   Influenza-Unspecified 12/15/2018   PFIZER(Purple Top)SARS-COV-2 Vaccination 05/07/2019, 05/28/2019, 12/14/2019, 02/24/2020   Pneumococcal Conjugate-13 09/25/2016   Pneumococcal Polysaccharide-23 10/02/2018   Zoster Recombinat (Shingrix) 11/29/2020    Past Medical History:  Diagnosis Date   Anemia 07/01/2019   Arthritis    Cervical dysplasia    Cervical polyp 78/12/3808   Complication of anesthesia    had ileus after hysterectomy 2011   History of breast reconstruction    Bilateral    History of chicken pox    History of measles, mumps, or rubella    Infertility, female    Menopausal symptoms    Trigger finger of all digits of right hand    RMF   Uterine fibroid    Allergies  Allergen Reactions   Dust Mite Extract    Molds & Smuts    Past Surgical History:  Procedure Laterality Date   ABDOMINAL HYSTERECTOMY  09/19/2009-09/22/2009   total   BREAST SURGERY  1998   "breast lift"   CESAREAN SECTION      x 2   INTRAOCULAR LENS INSERTION Bilateral    MOUTH SURGERY     SALPINGOOPHORECTOMY  09/19/2009   right   TRIGGER FINGER RELEASE Right 05/12/2017   Procedure: RIGHT TRIGGER FINGER RELEASE;  Surgeon: Cristine Polio, MD;  Location: [facility];  Service: Plastics;  Laterality: Right;   TUBAL LIGATION     bilateral   UTERINE FIBROID SURGERY     Family History  Problem Relation Age of Onset   Arthritis Mother    Breast cancer Mother    Lung cancer Father    Hypertension Brother    Throat cancer Brother    Leukemia Sister    Hypertension Son    Social History   Social History Narrative   Divorced. 1 child ([name]).   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    Drinks caffeine, uses herbal remedies. Takes a daily vitamin.    Wears seatbelt, bicycle helmet and dentures.    Smoke detector in the home. Firearms locked in the home.    Feels safe  in her relationships.     Allergies as of 01/09/2022       Reactions   Dust Mite Extract    Molds & Smuts         Medication List        Accurate as of January 09, 2022  1:35 PM. If you have any questions, ask your nurse or doctor.          glucosamine-chondroitin 500-400 MG tablet Take 1 tablet by mouth 3 (three) times daily.   multivitamin tablet Take 1 tablet by mouth daily.   Vitamin D (Ergocalciferol) 1.25 MG (50000 UNIT) Caps capsule Commonly known as: DRISDOL Take 1 capsule (50,000 Units total) by mouth every 7 (seven) days.        All past medical history, surgical history, allergies, family history, immunizations andmedications were updated in the EMR today and reviewed under the history and medication portions of their EMR.       MM 3D SCREEN BREAST BILATERAL Result Date: 11/20/2021  RECOMMENDATION: Screening mammogram in one year. (Code:SM-B-01Y) BI-RADS CATEGORY  1: Negative. Electronically Signed   By: Fidela Salisbury M.D.   On: 11/20/2021 14:03   ROS 14 pt review of systems performed and negative (unless mentioned in an HPI)  Objective: BP (!) 95/58   Pulse 65   Temp 98.1 F (36.7 C) (Oral)   Ht 5' 7.5" (1.715 m)   Wt 163 lb (73.9 kg)   SpO2 98%   BMI 25.15 kg/m  Physical Exam Vitals and nursing note reviewed.  Constitutional:      General: She is not in acute distress.    Appearance: Normal appearance. She is not ill-appearing or toxic-appearing.  HENT:     Head: Normocephalic and atraumatic.     Right Ear: Tympanic membrane, ear canal and external ear normal. There is no impacted cerumen.     Left Ear: Tympanic membrane, ear canal and external ear normal. There is no impacted cerumen.     Nose: No congestion or rhinorrhea.     Mouth/Throat:     Mouth: Mucous membranes are moist.     Pharynx: Oropharynx is clear. No oropharyngeal exudate or posterior oropharyngeal erythema.  Eyes:     General: No scleral icterus.       Right  eye: No discharge.        Left eye: No discharge.     Extraocular Movements: Extraocular movements intact.     Conjunctiva/sclera: Conjunctivae normal.     Pupils: Pupils are equal, round, and reactive to light.  Cardiovascular:     Rate and Rhythm: Normal rate and regular rhythm.     Pulses: Normal pulses.     Heart sounds: Normal heart sounds. No murmur heard.  No friction rub. No gallop.  Pulmonary:     Effort: Pulmonary effort is normal. No respiratory distress.     Breath sounds: Normal breath sounds. No stridor. No wheezing, rhonchi or rales.  Chest:     Chest wall: No tenderness.  Abdominal:     General: Abdomen is flat. Bowel sounds are normal. There is no distension.     Palpations: Abdomen is soft. There is no mass.     Tenderness: There is no abdominal tenderness. There is no right CVA tenderness, left CVA tenderness, guarding or rebound.     Hernia: No hernia is present.  Musculoskeletal:        General: No swelling, tenderness or deformity. Normal range of motion.     Cervical back: Normal range of motion and neck supple. No rigidity or tenderness.     Right lower leg: No edema.     Left lower leg: No edema.  Lymphadenopathy:     Cervical: No cervical adenopathy.  Skin:    General: Skin is warm and dry.     Coloration: Skin is not jaundiced or pale.     Findings: No bruising, erythema, lesion or rash.  Neurological:     General: No focal deficit present.     Mental Status: She is alert and oriented to person, place, and time. Mental status is at baseline.     Cranial Nerves: No cranial nerve deficit.     Sensory: No sensory deficit.     Motor: No weakness.     Coordination: Coordination normal.     Gait: Gait normal.     Deep Tendon Reflexes: Reflexes normal.  Psychiatric:        Mood and Affect: Mood normal.        Behavior: Behavior normal.        Thought Content: Thought content normal.        Judgment: Judgment normal.      No results  found.  Assessment/plan: Avarie Tavano is a 71 y.o. female present for CPE  Vitamin D deficiency - VITAMIN D 25 Hydroxy (Vit-D Deficiency, Fractures) Flu vaccine need Administered today  Lipid screening - Lipid panel Diabetes mellitus screening - Comprehensive metabolic panel - Hemoglobin A1c Screening for deficiency anemia - CBC Memory changes - TSH Routine general medical examination at a health care facility Colonoscopy: no fhx, Dr.Hung, 11/09/2019, rpt 7 yrs> then called back and told her 3 yrs per pt., diverticulosis present. Mammogram (50-74): Fhx present in mother, mammogram 03/2018, normal.> 11/2021-[facility] Cervical cancer screening(<65): Pt has a hystrectomy and follows with GYN.  Immunizations: tdap 09/2016.  PNA series completed.  Flu shot completed today.  Shingrix completed. Covid x4. Discussed booster.  Infectious disease screening: Hep C completed Dexa: 11/03/2020> NORMAL. No further testing recommnedned.  Patient was encouraged to exercise greater than 150 minutes a week. Patient was encouraged to choose a diet filled with fresh fruits and vegetables, and lean meats. AVS provided to patient today for education/recommendation on gender specific health and safety maintenance.  Return in about 1 year (around 01/13/2023) for cpe (20 min).  Orders Placed This Encounter  Procedures   Flu Vaccine QUAD High Dose(Fluad)   CBC   Comprehensive metabolic panel   Hemoglobin A1c   Lipid panel   TSH   VITAMIN D 25 Hydroxy (Vit-D Deficiency, Fractures)   No orders of the defined types were placed in this encounter.  Referral Orders  No referral(s) requested today     Electronically signed  by: Howard Pouch, DO [place]

## 2022-01-09 NOTE — Patient Instructions (Addendum)

## 2022-01-26 ENCOUNTER — Emergency Department (HOSPITAL_BASED_OUTPATIENT_CLINIC_OR_DEPARTMENT_OTHER): Payer: Medicare HMO | Admitting: Radiology

## 2022-01-26 ENCOUNTER — Other Ambulatory Visit: Payer: Self-pay

## 2022-01-26 ENCOUNTER — Encounter (HOSPITAL_BASED_OUTPATIENT_CLINIC_OR_DEPARTMENT_OTHER): Payer: Self-pay

## 2022-01-26 DIAGNOSIS — D649 Anemia, unspecified: Secondary | ICD-10-CM | POA: Insufficient documentation

## 2022-01-26 DIAGNOSIS — J189 Pneumonia, unspecified organism: Secondary | ICD-10-CM | POA: Diagnosis not present

## 2022-01-26 DIAGNOSIS — J181 Lobar pneumonia, unspecified organism: Secondary | ICD-10-CM | POA: Diagnosis not present

## 2022-01-26 DIAGNOSIS — R0602 Shortness of breath: Secondary | ICD-10-CM | POA: Diagnosis present

## 2022-01-26 DIAGNOSIS — R0789 Other chest pain: Secondary | ICD-10-CM | POA: Diagnosis not present

## 2022-01-26 NOTE — ED Triage Notes (Signed)
Pt has had sinus pressure since Monday night. Pt now complaining of R sided CP with inspiration with no radiation.

## 2022-01-27 ENCOUNTER — Emergency Department (HOSPITAL_BASED_OUTPATIENT_CLINIC_OR_DEPARTMENT_OTHER)
Admission: EM | Admit: 2022-01-27 | Discharge: 2022-01-27 | Disposition: A | Discharge status: Home / Self Care | Payer: Medicare HMO | Attending: Emergency Medicine | Admitting: Emergency Medicine

## 2022-01-27 ENCOUNTER — Emergency Department (HOSPITAL_BASED_OUTPATIENT_CLINIC_OR_DEPARTMENT_OTHER): Payer: Medicare HMO

## 2022-01-27 ENCOUNTER — Encounter (HOSPITAL_BASED_OUTPATIENT_CLINIC_OR_DEPARTMENT_OTHER): Payer: Self-pay | Admitting: Radiology

## 2022-01-27 DIAGNOSIS — R0781 Pleurodynia: Secondary | ICD-10-CM

## 2022-01-27 DIAGNOSIS — D649 Anemia, unspecified: Secondary | ICD-10-CM

## 2022-01-27 DIAGNOSIS — R079 Chest pain, unspecified: Secondary | ICD-10-CM | POA: Diagnosis not present

## 2022-01-27 DIAGNOSIS — J189 Pneumonia, unspecified organism: Secondary | ICD-10-CM

## 2022-01-27 LAB — BASIC METABOLIC PANEL
Anion gap: 10 (ref 5–15)
BUN: 12 mg/dL (ref 8–23)
CO2: 25 mmol/L (ref 22–32)
Calcium: 10 mg/dL (ref 8.9–10.3)
Chloride: 101 mmol/L (ref 98–111)
Creatinine, Ser: 0.76 mg/dL (ref 0.44–1.00)
GFR, Estimated: >60 mL/min (ref >60)
Glucose, Bld: 111 mg/dL — ABNORMAL HIGH (ref 70–99)
Potassium: 4 mmol/L (ref 3.5–5.1)
Sodium: 136 mmol/L (ref 135–145)

## 2022-01-27 LAB — TROPONIN I (HIGH SENSITIVITY)
Troponin I (High Sensitivity): <2 ng/L (ref <18)
Troponin I (High Sensitivity): <2 ng/L (ref <18)

## 2022-01-27 LAB — CBC
HCT: 35.8 % — ABNORMAL LOW (ref 36.0–46.0)
Hemoglobin: 11.7 g/dL — ABNORMAL LOW (ref 12.0–15.0)
MCH: 27.7 pg (ref 26.0–34.0)
MCHC: 32.7 g/dL (ref 30.0–36.0)
MCV: 84.6 fL (ref 80.0–100.0)
Platelets: 232 10*3/uL (ref 150–400)
RBC: 4.23 MIL/uL (ref 3.87–5.11)
RDW: 14.4 % (ref 11.5–15.5)
WBC: 5.7 10*3/uL (ref 4.0–10.5)
nRBC: 0 % (ref 0.0–0.2)

## 2022-01-27 MED ORDER — IOHEXOL 350 MG/ML SOLN
100.0000 mL | Freq: Once | INTRAVENOUS | Status: AC | PRN
  Administered 2022-01-27: 75 mL via INTRAVENOUS

## 2022-01-27 MED ORDER — AMOXICILLIN 500 MG PO CAPS
1000.0000 mg | ORAL_CAPSULE | Freq: Once | ORAL | Status: AC
  Administered 2022-01-27: 1000 mg via ORAL
  Filled 2022-01-27: qty 2

## 2022-01-27 MED ORDER — IPRATROPIUM-ALBUTEROL 0.5-2.5 (3) MG/3ML IN SOLN
3.0000 mL | Freq: Once | RESPIRATORY_TRACT | Status: AC
  Administered 2022-01-27: 3 mL via RESPIRATORY_TRACT
  Filled 2022-01-27: qty 3

## 2022-01-27 MED ORDER — AMOXICILLIN 500 MG PO CAPS: 1000.0000 mg | ORAL_CAPSULE | Freq: Two times a day (BID) | ORAL | 0 refills | Status: DC

## 2022-01-27 NOTE — Discharge Instructions (Addendum)
Take ibuprofen or naproxen as needed for pain or fever.  If you need additional relief, add acetaminophen.  Return to the emergency department if symptoms are getting worse, or not being adequately controlled at home.

## 2022-01-27 NOTE — ED Provider Notes (Signed)
[facility] EMERGENCY DEPT Provider Note   CSN: [id number] Arrival date & time: 01/26/22  2325     History  Chief Complaint  Patient presents with   Cough   Chest Pain    Sheri Kelly is a 71 y.o. female.  The history is provided by the patient.  Cough Associated symptoms: chest pain   Chest Pain Associated symptoms: cough   She has no significant past history and comes in because of right-sided chest pain and shortness of breath which started this evening.  She has had a nonproductive cough for the last 6 days.  She denies fever, chills, sweats.  She denies nausea or vomiting.  Chest pain started this evening.  She describes pain as sharp and nonradiating and worse with a deep breath.  She has felt short of breath since that pain started.  She is a non-smoker.   Home Medications Prior to Admission medications   Medication Sig Start Date End Date Taking? Authorizing Provider  glucosamine-chondroitin 500-400 MG tablet Take 1 tablet by mouth 3 (three) times daily.    [provider]  Multiple Vitamin (MULTIVITAMIN) tablet Take 1 tablet by mouth daily.    [provider]  Vitamin D, Ergocalciferol, (DRISDOL) 1.25 MG (50000 UNIT) CAPS capsule Take 1 capsule (50,000 Units total) by mouth every 7 (seven) days. 12/03/21   Kuneff, Renee A, DO      Allergies    Dust mite extract and Molds & smuts    Review of Systems   Review of Systems  Respiratory:  Positive for cough.   Cardiovascular:  Positive for chest pain.  All other systems reviewed and are negative.   Physical Exam Updated Vital Signs BP 121/70 (BP Location: Right Arm)   Pulse 72   Temp 99.3 F (37.4 C) (Oral)   Resp 19   Ht '5\' 7"'$  (1.702 m)   Wt 72.1 kg   SpO2 96%   BMI 24.90 kg/m  Physical Exam Vitals and nursing note reviewed.   71 year old female, resting comfortably and in no acute distress. Vital signs are normal. Oxygen saturation is 96%, which is normal. Head is  normocephalic and atraumatic. PERRLA, EOMI. Oropharynx is clear. Neck is nontender and supple without adenopathy or JVD. Back is nontender and there is no CVA tenderness. Lungs are clear without rales, wheezes, or rhonchi. Chest is nontender. Heart has regular rate and rhythm without murmur. Abdomen is soft, flat, nontender. Extremities have no cyanosis or edema, full range of motion is present. Skin is warm and dry without rash. Neurologic: Mental status is normal, cranial nerves are intact, moves all extremities equally.  ED Results / Procedures / Treatments   Labs (all labs ordered are listed, but only abnormal results are displayed) Labs Reviewed  BASIC METABOLIC PANEL - Abnormal; Notable for the following components:      Result Value   Glucose, Bld 111 (*)    All other components within normal limits  CBC - Abnormal; Notable for the following components:   Hemoglobin 11.7 (*)    HCT 35.8 (*)    All other components within normal limits  TROPONIN I (HIGH SENSITIVITY)  TROPONIN I (HIGH SENSITIVITY)    EKG EKG Interpretation  Date/Time:  Saturday January 26 2022 23:50:21 EDT Ventricular Rate:  80 PR Interval:  156 QRS Duration: 78 QT Interval:  360 QTC Calculation: 415 R Axis:   74 Text Interpretation: Normal sinus rhythm Normal ECG No previous ECGs available Confirmed by Roxanne Mins,  Shanon Brow [phone]) on 01/26/2022 11:54:57 PM  Radiology CT Angio Chest PE W and/or Wo Contrast  Result Date: 01/27/2022 CLINICAL DATA:  Pulmonary embolism suspected, high probability. Right-sided chest pain with inspiration. EXAM: CT ANGIOGRAPHY CHEST WITH CONTRAST TECHNIQUE: Multidetector CT imaging of the chest was performed using the standard protocol during bolus administration of intravenous contrast. Multiplanar CT image reconstructions and MIPs were obtained to evaluate the vascular anatomy. RADIATION DOSE REDUCTION: This exam was performed according to the departmental dose-optimization  program which includes automated exposure control, adjustment of the mA and/or kV according to patient size and/or use of iterative reconstruction technique. CONTRAST:  27m OMNIPAQUE IOHEXOL 350 MG/ML SOLN COMPARISON:  01/27/2022. FINDINGS: Cardiovascular: The heart is enlarged and there is a small pericardial effusion. The aorta and pulmonary trunk are normal in caliber. No pulmonary artery filling defect is identified. Mediastinum/Nodes: No mediastinal, hilar, or axillary lymphadenopathy. The thyroid gland, trachea, and esophagus are within normal limits. Lungs/Pleura: Focal consolidation is noted in the right middle lobe. Mild atelectasis is present in the right lower lobe. No effusion or pneumothorax. Upper Abdomen: A simple cyst is present in the mid left kidney. No acute abnormality. Musculoskeletal: Mild degenerative changes in the thoracic spine. No acute or suspicious osseous abnormality. Review of the MIP images confirms the above findings. IMPRESSION: 1. No evidence of pulmonary embolism. 2. Focal consolidation in the right middle lobe suggesting pneumonia. Follow-up is recommended until resolution to exclude underlying neoplasm. 3. Cardiomegaly with small pericardial effusion. Electronically Signed   By: LBrett FairyM.D.   On: 01/27/2022 04:54   DG Chest 2 View  Result Date: 01/27/2022 CLINICAL DATA:  Chest pain on the right EXAM: CHEST - 2 VIEW COMPARISON:  09/26/2009 FINDINGS: Cardiac shadow is enlarged. Lungs are well aerated bilaterally. There is an ovoid soft tissue density in the right perihilar region measuring up to 2.8 cm suspicious for focal mass lesion. CT of the chest is recommended with contrast for further evaluation. No bony abnormality is noted. IMPRESSION: Ovoid soft tissue lesion in the right perihilar region. CT of the chest is recommended for further evaluation. Electronically Signed   By: MInez CatalinaM.D.   On: 01/27/2022 00:04    Procedures Procedures    Medications  Ordered in ED Medications  ipratropium-albuterol (DUONEB) 0.5-2.5 (3) MG/3ML nebulizer solution 3 mL (has no administration in time range)    ED Course/ Medical Decision Making/ A&P                           Medical Decision Making Amount and/or Complexity of Data Reviewed Labs: ordered. Radiology: ordered.  Risk Prescription drug management.   Pleuritic chest pain concerning for pulmonary embolism.  Also consider pneumonia, bronchitis, ACS, pleurisy.  Nonproductive cough, likely viral bronchitis.  Consider pneumonia.  I have reviewed and interpreted her electrocardiogram, and my interpretation is normal electrocardiogram.  Chest x-ray shows no definite infiltrate, but ovoid soft tissue density in the right perihilar region.  I have independently viewed the images, and agree with radiologist's interpretation.  I have reviewed and interpreted her laboratory tests, and my interpretation is stable anemia, minimal elevation of random glucose, normal troponin with repeat troponin pending.  I have ordered a nebulizer treatment with albuterol and ipratropium to help treat her cough.  I have ordered a CT angiogram of the chest to evaluate the mass and rule out pulmonary embolism.  CT angiogram shows no evidence of pulmonary embolism.  Consolidation  is noted in the right middle lobe.  I have independently viewed the images, and agree with the radiologist's interpretation.  The infiltrate does appear to extend all the way to the pleura, and inflammation around the infiltrate probably accounts for her pleuritic chest pain.  I have gone back to explain the findings to the patient, and she states that her pain is now gone.  I am giving her an initial dose of amoxicillin and discharging her with a prescription for amoxicillin.  She is advised to use over-the-counter NSAIDs and acetaminophen as needed for fever and pain, return if symptoms are worsening.  I have recommended follow-up with PCP in 2 weeks to  ensure adequate response to treatment.  Final Clinical Impression(s) / ED Diagnoses Final diagnoses:  Community acquired pneumonia of right middle lobe of lung  Pleuritic chest pain  Normochromic normocytic anemia    Rx / DC Orders ED Discharge Orders          Ordered    amoxicillin (AMOXIL) 500 MG capsule  2 times daily        01/27/22 2229              Delora Fuel, MD 79/89/21 [phone]

## 2022-02-15 ENCOUNTER — Telehealth: Payer: Self-pay

## 2022-02-15 NOTE — Telephone Encounter (Signed)
     Patient  visit on 01/27/2022  at [facility] was for Pneumonia, unspecified organism.  Have you been able to follow up with your primary care physician? Patient is feeling much better and did not follow up with her PCP.  The patient was or was not able to obtain any needed medicine or equipment. Patient filled all prescriptions.  Are there diet recommendations that you are having difficulty following? No  Patient expresses understanding of discharge instructions and education provided has no other needs at this time.    [place] Resource Care Guide   [email or web address]  ?? [phone]   Website: [email or web address]  [email or web address]

## 2022-06-18 DIAGNOSIS — H00026 Hordeolum internum left eye, unspecified eyelid: Secondary | ICD-10-CM | POA: Diagnosis not present

## 2022-07-24 ENCOUNTER — Telehealth: Payer: Self-pay | Admitting: Family Medicine

## 2022-07-24 NOTE — Telephone Encounter (Signed)
Spoke to pt. She was advised to call back if she notice any symptoms so we can get her scheduled.

## 2022-07-24 NOTE — Telephone Encounter (Signed)
Patient called to report being bitten by a tick yesterday, however she did get it removed. She would like someone to call her to follow up to see if it is anything she needs to do regarding the bite.

## 2022-08-14 ENCOUNTER — Ambulatory Visit (INDEPENDENT_AMBULATORY_CARE_PROVIDER_SITE_OTHER): Payer: Medicare HMO

## 2022-08-14 VITALS — Wt 168.0 lb

## 2022-08-14 DIAGNOSIS — Z0000 Encounter for general adult medical examination without abnormal findings: Secondary | ICD-10-CM | POA: Diagnosis not present

## 2022-08-14 NOTE — Patient Instructions (Signed)
Sheri Kelly , Thank you for taking time to come for your Medicare Wellness Visit. I appreciate your ongoing commitment to your health goals. Please review the following plan we discussed and let me know if I can assist you in the future.   These are the goals we discussed:  Goals      Exercise 150 min/wk Moderate Activity     Increase exercise.      Patient Stated     Stop stressing over things I can't control     Patient Stated     Get back in the gym        This is a list of the screening recommended for you and due dates:  Health Maintenance  Topic Date Due   DTaP/Tdap/Td vaccine (1 - Tdap) Never done   Zoster (Shingles) Vaccine (2 of 2) 01/24/2021   Colon Cancer Screening  11/09/2022   Flu Shot  11/14/2022   Medicare Annual Wellness Visit  08/14/2023   Mammogram  11/20/2023   Pneumonia Vaccine  Completed   DEXA scan (bone density measurement)  Completed   Hepatitis C Screening: USPSTF Recommendation to screen - Ages 26-79 yo.  Completed   HPV Vaccine  Aged Out   COVID-19 Vaccine  Discontinued    Advanced directives: Please bring a copy of your health care power of attorney and living will to the office at your convenience.  Conditions/risks identified: get back into gym   Next appointment: Follow up in one year for your annual wellness visit    Preventive Care 65 Years and Older, Female Preventive care refers to lifestyle choices and visits with your health care provider that can promote health and wellness. What does preventive care include? A yearly physical exam. This is also called an annual well check. Dental exams once or twice a year. Routine eye exams. Ask your health care provider how often you should have your eyes checked. Personal lifestyle choices, including: Daily care of your teeth and gums. Regular physical activity. Eating a healthy diet. Avoiding tobacco and drug use. Limiting alcohol use. Practicing safe sex. Taking low-dose aspirin every  day. Taking vitamin and mineral supplements as recommended by your health care provider. What happens during an annual well check? The services and screenings done by your health care provider during your annual well check will depend on your age, overall health, lifestyle risk factors, and family history of disease. Counseling  Your health care provider may ask you questions about your: Alcohol use. Tobacco use. Drug use. Emotional well-being. Home and relationship well-being. Sexual activity. Eating habits. History of falls. Memory and ability to understand (cognition). Work and work Astronomer. Reproductive health. Screening  You may have the following tests or measurements: Height, weight, and BMI. Blood pressure. Lipid and cholesterol levels. These may be checked every 5 years, or more frequently if you are over 35 years old. Skin check. Lung cancer screening. You may have this screening every year starting at age 52 if you have a 30-pack-year history of smoking and currently smoke or have quit within the past 15 years. Fecal occult blood test (FOBT) of the stool. You may have this test every year starting at age 8. Flexible sigmoidoscopy or colonoscopy. You may have a sigmoidoscopy every 5 years or a colonoscopy every 10 years starting at age 9. Hepatitis C blood test. Hepatitis B blood test. Sexually transmitted disease (STD) testing. Diabetes screening. This is done by checking your blood sugar (glucose) after you have not eaten  for a while (fasting). You may have this done every 1-3 years. Bone density scan. This is done to screen for osteoporosis. You may have this done starting at age 64. Mammogram. This may be done every 1-2 years. Talk to your health care provider about how often you should have regular mammograms. Talk with your health care provider about your test results, treatment options, and if necessary, the need for more tests. Vaccines  Your health care  provider may recommend certain vaccines, such as: Influenza vaccine. This is recommended every year. Tetanus, diphtheria, and acellular pertussis (Tdap, Td) vaccine. You may need a Td booster every 10 years. Zoster vaccine. You may need this after age 65. Pneumococcal 13-valent conjugate (PCV13) vaccine. One dose is recommended after age 8. Pneumococcal polysaccharide (PPSV23) vaccine. One dose is recommended after age 4. Talk to your health care provider about which screenings and vaccines you need and how often you need them. This information is not intended to replace advice given to you by your health care provider. Make sure you discuss any questions you have with your health care provider. Document Released: 04/28/2015 Document Revised: 12/20/2015 Document Reviewed: 01/31/2015 Elsevier Interactive Patient Education  2017 [employer].  Fall Prevention in the Home Falls can cause injuries. They can happen to people of all ages. There are many things you can do to make your home safe and to help prevent falls. What can I do on the outside of my home? Regularly fix the edges of walkways and driveways and fix any cracks. Remove anything that might make you trip as you walk through a door, such as a raised step or threshold. Trim any bushes or trees on the path to your home. Use bright outdoor lighting. Clear any walking paths of anything that might make someone trip, such as rocks or tools. Regularly check to see if handrails are loose or broken. Make sure that both sides of any steps have handrails. Any raised decks and porches should have guardrails on the edges. Have any leaves, snow, or ice cleared regularly. Use sand or salt on walking paths during winter. Clean up any spills in your garage right away. This includes oil or grease spills. What can I do in the bathroom? Use night lights. Install grab bars by the toilet and in the tub and shower. Do not use towel bars as grab  bars. Use non-skid mats or decals in the tub or shower. If you need to sit down in the shower, use a plastic, non-slip stool. Keep the floor dry. Clean up any water that spills on the floor as soon as it happens. Remove soap buildup in the tub or shower regularly. Attach bath mats securely with double-sided non-slip rug tape. Do not have throw rugs and other things on the floor that can make you trip. What can I do in the bedroom? Use night lights. Make sure that you have a light by your bed that is easy to reach. Do not use any sheets or blankets that are too big for your bed. They should not hang down onto the floor. Have a firm chair that has side arms. You can use this for support while you get dressed. Do not have throw rugs and other things on the floor that can make you trip. What can I do in the kitchen? Clean up any spills right away. Avoid walking on wet floors. Keep items that you use a lot in easy-to-reach places. If you need to reach something  above you, use a strong step stool that has a grab bar. Keep electrical cords out of the way. Do not use floor polish or wax that makes floors slippery. If you must use wax, use non-skid floor wax. Do not have throw rugs and other things on the floor that can make you trip. What can I do with my stairs? Do not leave any items on the stairs. Make sure that there are handrails on both sides of the stairs and use them. Fix handrails that are broken or loose. Make sure that handrails are as long as the stairways. Check any carpeting to make sure that it is firmly attached to the stairs. Fix any carpet that is loose or worn. Avoid having throw rugs at the top or bottom of the stairs. If you do have throw rugs, attach them to the floor with carpet tape. Make sure that you have a light switch at the top of the stairs and the bottom of the stairs. If you do not have them, ask someone to add them for you. What else can I do to help prevent  falls? Wear shoes that: Do not have high heels. Have rubber bottoms. Are comfortable and fit you well. Are closed at the toe. Do not wear sandals. If you use a stepladder: Make sure that it is fully opened. Do not climb a closed stepladder. Make sure that both sides of the stepladder are locked into place. Ask someone to hold it for you, if possible. Clearly mark and make sure that you can see: Any grab bars or handrails. First and last steps. Where the edge of each step is. Use tools that help you move around (mobility aids) if they are needed. These include: Canes. Walkers. Scooters. Crutches. Turn on the lights when you go into a dark area. Replace any light bulbs as soon as they burn out. Set up your furniture so you have a clear path. Avoid moving your furniture around. If any of your floors are uneven, fix them. If there are any pets around you, be aware of where they are. Review your medicines with your doctor. Some medicines can make you feel dizzy. This can increase your chance of falling. Ask your doctor what other things that you can do to help prevent falls. This information is not intended to replace advice given to you by your health care provider. Make sure you discuss any questions you have with your health care provider. Document Released: 01/26/2009 Document Revised: 09/07/2015 Document Reviewed: 05/06/2014 Elsevier Interactive Patient Education  2017 [employer].

## 2022-08-14 NOTE — Progress Notes (Signed)
I connected with  Sheri Kelly on 08/14/22 by a audio enabled telemedicine application and verified that I am speaking with the correct person using two identifiers.  Patient Location: Home  Provider Location: Home Office  I discussed the limitations of evaluation and management by telemedicine. The patient expressed understanding and agreed to proceed.   Subjective:   Sheri Kelly is a 72 y.o. female who presents for Medicare Annual (Subsequent) preventive examination.  Review of Systems     Cardiac Risk Factors include: advanced age (>36men, >69 women)     Objective:    Today's Vitals   08/14/22 1118  Weight: 168 lb (76.2 kg)   Body mass index is 26.31 kg/m.     08/14/2022   11:24 AM 01/26/2022   11:41 PM 07/05/2021    2:03 PM 05/31/2020   11:21 AM 10/02/2018    1:05 PM 09/26/2017   10:23 AM 05/12/2017    8:40 AM  Advanced Directives  Does Patient Have a Medical Advance Directive? Yes No Yes Yes Yes Yes Yes  Type of Estate agent of [place];Living will  Living will;Healthcare Power of [employer] Power of [place];Living will Healthcare Power of [place];Living will Living will;Healthcare Power of Attorney   Does patient want to make changes to medical advance directive?   No - Patient declined    No - Patient declined  Copy of Healthcare Power of Attorney in Chart? No - copy requested   No - copy requested No - copy requested No - copy requested   Would patient like information on creating a medical advance directive?  No - Patient declined         Current Medications (verified) Outpatient Encounter Medications as of 08/14/2022  Medication Sig   Ascorbic Acid (VITAMIN C PO) Take by mouth.   VITAMIN A PO Take by mouth.   Vitamin D, Ergocalciferol, (DRISDOL) 1.25 MG (50000 UNIT) CAPS capsule Take 1 capsule (50,000 Units total) by mouth every 7 (seven) days.   [DISCONTINUED] amoxicillin (AMOXIL) 500 MG capsule Take 2 capsules (1,000 mg total) by  mouth 2 (two) times daily.   [DISCONTINUED] glucosamine-chondroitin 500-400 MG tablet Take 1 tablet by mouth 3 (three) times daily.   [DISCONTINUED] Multiple Vitamin (MULTIVITAMIN) tablet Take 1 tablet by mouth daily.   No facility-administered encounter medications on file as of 08/14/2022.    Allergies (verified) Dust mite extract and Molds & smuts   History: Past Medical History:  Diagnosis Date   Anemia 07/01/2019   Arthritis    Cervical dysplasia    Cervical polyp 01/14/2008   Complication of anesthesia    had ileus after hysterectomy 2011   History of breast reconstruction    Bilateral    History of chicken pox    History of measles, mumps, or rubella    Infertility, female    Menopausal symptoms    Trigger finger of all digits of right hand    RMF   Uterine fibroid    Past Surgical History:  Procedure Laterality Date   ABDOMINAL HYSTERECTOMY  09/19/2009-09/22/2009   total   BREAST SURGERY  1998   "breast lift"   CESAREAN SECTION      x 2   INTRAOCULAR LENS INSERTION Bilateral    MOUTH SURGERY     SALPINGOOPHORECTOMY  09/19/2009   right   TRIGGER FINGER RELEASE Right 05/12/2017   Procedure: RIGHT TRIGGER FINGER RELEASE;  Surgeon: Louisa Second, MD;  Location: [facility];  Service: Plastics;  Laterality: Right;   TUBAL LIGATION     bilateral   UTERINE FIBROID SURGERY     Family History  Problem Relation Age of Onset   Arthritis Mother    Breast cancer Mother    Lung cancer Father    Hypertension Brother    Throat cancer Brother    Leukemia Sister    Hypertension Son    Social History   Socioeconomic History   Marital status: Divorced    Spouse name: Not on file   Number of children: 1   Years of education: 16   Highest education level: Not on file  Occupational History   Occupation: Psychologist, sport and exercise- [employer]  Tobacco Use   Smoking status: Former    Types: Cigarettes    Quit date: 09/11/2009    Years since quitting: 12.9    Smokeless tobacco: Never   Tobacco comments:    socially only for a few years  Vaping Use   Vaping Use: Never used  Substance and Sexual Activity   Alcohol use: Yes    Alcohol/week: 10.0 standard drinks of alcohol    Types: 10 Glasses of wine per week    Comment: social   Drug use: No   Sexual activity: Never  Other Topics Concern   Not on file  Social History Narrative   Divorced. 1 child ([name]).   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    Drinks caffeine, uses herbal remedies. Takes a daily vitamin.    Wears seatbelt, bicycle helmet and dentures.    Smoke detector in the home. Firearms locked in the home.    Feels safe in her relationships.    Social Determinants of Health   Financial Resource Strain: Low Risk  (08/14/2022)   Overall Financial Resource Strain (CARDIA)    Difficulty of Paying Living Expenses: Not hard at all  Food Insecurity: No Food Insecurity (08/14/2022)   Hunger Vital Sign    Worried About Running Out of Food in the Last Year: Never true    Ran Out of Food in the Last Year: Never true  Transportation Needs: No Transportation Needs (08/14/2022)   PRAPARE - Administrator, Civil Service (Medical): No    Lack of Transportation (Non-Medical): No  Physical Activity: Sufficiently Active (08/14/2022)   Exercise Vital Sign    Days of Exercise per Week: 5 days    Minutes of Exercise per Session: 120 min  Stress: No Stress Concern Present (08/14/2022)   [employer] of Occupational Health - Occupational Stress Questionnaire    Feeling of Stress : Not at all  Social Connections: Moderately Isolated (08/14/2022)   Social Connection and Isolation Panel [NHANES]    Frequency of Communication with Friends and Family: More than three times a week    Frequency of Social Gatherings with Friends and Family: More than three times a week    Attends Religious Services: More than 4 times per year    Active Member of [employer] or Organizations: No    Attends Hospital doctor: Never    Marital Status: Divorced    Tobacco Counseling Counseling given: Not Answered Tobacco comments: socially only for a few years   Clinical Intake:  Pre-visit preparation completed: Yes  Pain : No/denies pain     BMI - recorded: 26.31 Nutritional Status: BMI 25 -29 Overweight Nutritional Risks: None Diabetes: No  How often do you need to have someone help you when you read instructions, pamphlets, or other  written materials from your doctor or pharmacy?: 1 - Never  Diabetic?no  Interpreter Needed?: No  Information entered by :: Lanier Ensign, LPN   Activities of Daily Living    08/14/2022   11:25 AM  In your present state of health, do you have any difficulty performing the following activities:  Hearing? 0  Vision? 0  Difficulty concentrating or making decisions? 0  Walking or climbing stairs? 0  Dressing or bathing? 0  Doing errands, shopping? 0  Preparing Food and eating ? N  Using the Toilet? N  In the past six months, have you accidently leaked urine? N  Do you have problems with loss of bowel control? N  Managing your Medications? N  Managing your Finances? N  Housekeeping or managing your Housekeeping? N    Patient Care Team: Natalia Leatherwood, DO as PCP - General (Family Medicine) Osborn Coho, MD as Consulting Physician (Obstetrics and Gynecology) Ander Purpura, OD (Optometry) Ollen Gross, MD as Consulting Physician (Orthopedic Surgery) Jeani Hawking, MD as Consulting Physician (Gastroenterology)  Indicate any recent Medical Services you may have received from other than [facility] providers in the past year (date may be approximate).     Assessment:   This is a routine wellness examination for Sheri.  Hearing/Vision screen Hearing Screening - Comments:: Pt denies any hearing issues  Vision Screening - Comments:: Pt [facility]   Dietary issues and exercise activities  discussed: Current Exercise Habits: Home exercise routine, Type of exercise: walking, Time (Minutes): > 60, Frequency (Times/Week): 5, Weekly Exercise (Minutes/Week): 0   Goals Addressed             This Visit's Progress    Patient Stated       Get back in the gym       Depression Screen    08/14/2022   11:23 AM 07/05/2021    2:02 PM 01/08/2021    1:10 PM 05/31/2020   11:23 AM 10/02/2018    1:13 PM 12/03/2017   10:21 AM 09/26/2017   10:24 AM  PHQ 2/9 Scores  PHQ - 2 Score 0 0 0 1 0 0 0    Fall Risk    08/14/2022   11:25 AM 07/05/2021    2:03 PM 01/08/2021    1:10 PM 05/31/2020   11:22 AM 10/02/2018    1:09 PM  Fall Risk   Falls in the past year? 0 0 0 0 0  Number falls in past yr: 0 0 0 0   Injury with Fall? 0 0 0 0   Risk for fall due to : Impaired vision      Follow up Falls prevention discussed  Falls evaluation completed Falls prevention discussed Education provided;Falls evaluation completed;Falls prevention discussed    FALL RISK PREVENTION PERTAINING TO THE HOME:  Any stairs in or around the home? Yes  If so, are there any without handrails? No  Home free of loose throw rugs in walkways, pet beds, electrical cords, etc? Yes  Adequate lighting in your home to reduce risk of falls? Yes   ASSISTIVE DEVICES UTILIZED TO PREVENT FALLS:  Life alert? No  Use of a cane, walker or w/c? No  Grab bars in the bathroom? No  Shower chair or bench in shower? Yes  Elevated toilet seat or a handicapped toilet? No   TIMED UP AND GO:  Was the test performed? No .   Cognitive Function:  08/14/2022   11:26 AM 07/05/2021    2:06 PM  6CIT Screen  What Year? 0 points 0 points  What month? 0 points 0 points  What time? 0 points 0 points  Count back from 20 0 points 0 points  Months in reverse 0 points 0 points  Repeat phrase 0 points 0 points  Total Score 0 points 0 points    Immunizations Immunization History  Administered Date(s) Administered   Fluad Quad(high  Dose 65+) 01/08/2021, 01/09/2022   Influenza, High Dose Seasonal PF 02/24/2018   Influenza,inj,quad, With Preservative 11/13/2016   Influenza-Unspecified 12/15/2018   PFIZER(Purple Top)SARS-COV-2 Vaccination 05/07/2019, 05/28/2019, 12/14/2019, 02/24/2020   Pneumococcal Conjugate-13 09/25/2016   Pneumococcal Polysaccharide-23 10/02/2018   Zoster Recombinat (Shingrix) 11/29/2020    TDAP status: Due, Education has been provided regarding the importance of this vaccine. Advised may receive this vaccine at local pharmacy or Health Dept. Aware to provide a copy of the vaccination record if obtained from local pharmacy or Health Dept. Verbalized acceptance and understanding.  Flu Vaccine status: Up to date  Pneumococcal vaccine status: Up to date  Covid-19 vaccine status: Completed vaccines  Qualifies for Shingles Vaccine? Yes   Zostavax completed Yes   Shingrix Completed?: No.    Education has been provided regarding the importance of this vaccine. Patient has been advised to call insurance company to determine out of pocket expense if they have not yet received this vaccine. Advised may also receive vaccine at local pharmacy or Health Dept. Verbalized acceptance and understanding.  Screening Tests Health Maintenance  Topic Date Due   DTaP/Tdap/Td (1 - Tdap) Never done   Zoster Vaccines- Shingrix (2 of 2) 01/24/2021   COLONOSCOPY (Pts 45-67yrs Insurance coverage will need to be confirmed)  11/09/2022   INFLUENZA VACCINE  11/14/2022   Medicare Annual Wellness (AWV)  08/14/2023   MAMMOGRAM  11/20/2023   Pneumonia Vaccine 7+ Years old  Completed   DEXA SCAN  Completed   Hepatitis C Screening  Completed   HPV VACCINES  Aged Out   COVID-19 Vaccine  Discontinued    Health Maintenance  Health Maintenance Due  Topic Date Due   DTaP/Tdap/Td (1 - Tdap) Never done   Zoster Vaccines- Shingrix (2 of 2) 01/24/2021    Colorectal cancer screening: Type of screening: Colonoscopy. Completed  11/09/19. Repeat every 3 years  Mammogram status: Completed 11/20/21. Repeat every year  Bone Density status: Completed 11/03/20. Results reflect: Bone density results: NORMAL. Repeat every 2 years.  Additional Screening:  Hepatitis C Screening:  Completed 08/02/15  Vision Screening: Recommended annual ophthalmology exams for early detection of glaucoma and other disorders of the eye. Is the patient up to date with their annual eye exam?  No  Who is the provider or what is the name of the office in which the patient attends annual eye exams? [facility] If pt is not established with a provider, would they like to be referred to a provider to establish care? No .   Dental Screening: Recommended annual dental exams for proper oral hygiene  Community Resource Referral / Chronic Care Management: CRR required this visit?  No   CCM required this visit?  No      Plan:     I have personally reviewed and noted the following in the patient's chart:   Medical and social history Use of alcohol, tobacco or illicit drugs  Current medications and supplements including opioid prescriptions. Patient is not currently taking opioid prescriptions. Functional ability and  status Nutritional status Physical activity Advanced directives List of other physicians Hospitalizations, surgeries, and ER visits in previous 12 months Vitals Screenings to include cognitive, depression, and falls Referrals and appointments  In addition, I have reviewed and discussed with patient certain preventive protocols, quality metrics, and best practice recommendations. A written personalized care plan for preventive services as well as general preventive health recommendations were provided to patient.     Marzella Schlein, LPN   04/20/1094   Nurse Notes: none

## 2022-10-01 DIAGNOSIS — H43393 Other vitreous opacities, bilateral: Secondary | ICD-10-CM | POA: Diagnosis not present

## 2022-10-01 DIAGNOSIS — H2513 Age-related nuclear cataract, bilateral: Secondary | ICD-10-CM | POA: Diagnosis not present

## 2022-10-07 DIAGNOSIS — K5730 Diverticulosis of large intestine without perforation or abscess without bleeding: Secondary | ICD-10-CM | POA: Diagnosis not present

## 2022-10-07 DIAGNOSIS — K5900 Constipation, unspecified: Secondary | ICD-10-CM | POA: Diagnosis not present

## 2022-10-07 DIAGNOSIS — Z86010 Personal history of colonic polyps: Secondary | ICD-10-CM | POA: Diagnosis not present

## 2022-10-28 ENCOUNTER — Other Ambulatory Visit: Payer: Self-pay | Admitting: Family Medicine

## 2022-10-28 DIAGNOSIS — Z1231 Encounter for screening mammogram for malignant neoplasm of breast: Secondary | ICD-10-CM

## 2022-10-31 DIAGNOSIS — D123 Benign neoplasm of transverse colon: Secondary | ICD-10-CM | POA: Diagnosis not present

## 2022-10-31 DIAGNOSIS — K5730 Diverticulosis of large intestine without perforation or abscess without bleeding: Secondary | ICD-10-CM | POA: Diagnosis not present

## 2022-10-31 DIAGNOSIS — Z800 Family history of malignant neoplasm of digestive organs: Secondary | ICD-10-CM | POA: Diagnosis not present

## 2022-10-31 DIAGNOSIS — K5900 Constipation, unspecified: Secondary | ICD-10-CM | POA: Diagnosis not present

## 2022-10-31 DIAGNOSIS — K635 Polyp of colon: Secondary | ICD-10-CM | POA: Diagnosis not present

## 2022-10-31 DIAGNOSIS — Z86010 Personal history of colonic polyps: Secondary | ICD-10-CM | POA: Diagnosis not present

## 2022-11-22 ENCOUNTER — Encounter: Payer: Self-pay | Admitting: Family Medicine

## 2022-11-22 ENCOUNTER — Other Ambulatory Visit: Payer: Self-pay | Admitting: Family Medicine

## 2022-11-22 ENCOUNTER — Telehealth: Payer: Medicare HMO | Admitting: Family Medicine

## 2022-11-22 DIAGNOSIS — U071 COVID-19: Secondary | ICD-10-CM

## 2022-11-22 DIAGNOSIS — R051 Acute cough: Secondary | ICD-10-CM

## 2022-11-22 LAB — POC COVID19 BINAXNOW: SARS Coronavirus 2 Ag: POSITIVE — AB

## 2022-11-22 MED ORDER — MOLNUPIRAVIR EUA 200MG CAPSULE: 4.0000 | ORAL_CAPSULE | Freq: Two times a day (BID) | ORAL | 0 refills | Status: AC

## 2022-11-22 NOTE — Progress Notes (Signed)
VIRTUAL VISIT VIA VIDEO  I connected with Sheri Kelly on 11/22/22 at  2:40 PM EDT by a video enabled telemedicine application and verified that I am speaking with the correct person using two identifiers. Location patient: Home Location provider: [facility], Office Persons participating in the virtual visit: Patient, Dr. Claiborne Billings and Ivonne Andrew, CMA  I discussed the limitations of evaluation and management by telemedicine and the availability of in person appointments. The patient expressed understanding and agreed to proceed.    [name] , 1950/06/13, 72 y.o., female MRN: [id number] Patient Care Team    Relationship Specialty Notifications Start End  Natalia Leatherwood, DO PCP - General Family Medicine  09/25/16   Osborn Coho, MD Consulting Physician Obstetrics and Gynecology  09/25/16   Ander Purpura, OD  Optometry  09/25/16    Comment: [facility]  Ollen Gross, MD Consulting Physician Orthopedic Surgery  09/26/17   Jeani Hawking, MD Consulting Physician Gastroenterology  07/14/20     Chief Complaint  Patient presents with   Covid Positive    Cough, runny nose, sneezing; going on 2 weeks     Subjective: Sheri Kelly is a 72 y.o. Pt presents for an OV with complaints of cough, runny nose, sneezing of 5 days duration. Pt has tried advil to ease their symptoms.  + covid test at home. She recently returned from a cruise.      08/14/2022   11:23 AM 07/05/2021    2:02 PM 01/08/2021    1:10 PM 05/31/2020   11:23 AM 10/02/2018    1:13 PM  Depression screen PHQ 2/9  Decreased Interest 0 0 0 0 0  Down, Depressed, Hopeless 0 0 0 1 0  PHQ - 2 Score 0 0 0 1 0    Allergies  Allergen Reactions   Dust Mite Extract    Molds & Smuts    Social History   Social History Narrative   Divorced. 1 child ([name]).   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    Drinks caffeine, uses herbal remedies. Takes a daily vitamin.    Wears seatbelt, bicycle helmet and dentures.     Smoke detector in the home. Firearms locked in the home.    Feels safe in her relationships.    Past Medical History:  Diagnosis Date   Anemia 07/01/2019   Arthritis    Cervical dysplasia    Cervical polyp 01/14/2008   Complication of anesthesia    had ileus after hysterectomy 2011   History of breast reconstruction    Bilateral    History of chicken pox    History of measles, mumps, or rubella    Infertility, female    Menopausal symptoms    Trigger finger of all digits of right hand    RMF   Uterine fibroid    Past Surgical History:  Procedure Laterality Date   ABDOMINAL HYSTERECTOMY  09/19/2009-09/22/2009   total   BREAST SURGERY  1998   "breast lift"   CESAREAN SECTION      x 2   INTRAOCULAR LENS INSERTION Bilateral    MOUTH SURGERY     SALPINGOOPHORECTOMY  09/19/2009   right   TRIGGER FINGER RELEASE Right 05/12/2017   Procedure: RIGHT TRIGGER FINGER RELEASE;  Surgeon: Louisa Second, MD;  Location: [facility];  Service: Plastics;  Laterality: Right;   TUBAL LIGATION     bilateral   UTERINE FIBROID SURGERY  Family History  Problem Relation Age of Onset   Arthritis Mother    Breast cancer Mother    Lung cancer Father    Hypertension Brother    Throat cancer Brother    Leukemia Sister    Hypertension Son    Allergies as of 11/22/2022       Reactions   Dust Mite Extract    Molds & Smuts         Medication List        Accurate as of November 22, 2022  2:01 PM. If you have any questions, ask your nurse or doctor.          molnupiravir EUA 200 mg Caps capsule Commonly known as: LAGEVRIO Take 4 capsules (800 mg total) by mouth 2 (two) times daily for 5 days. Started by: Felix Pacini   VITAMIN A PO Take by mouth.   VITAMIN C PO Take by mouth.   Vitamin D (Ergocalciferol) 1.25 MG (50000 UNIT) Caps capsule Commonly known as: DRISDOL Take 1 capsule (50,000 Units total) by mouth every 7 (seven) days.        All past medical  history, surgical history, allergies, family history, immunizations andmedications were updated in the EMR today and reviewed under the history and medication portions of their EMR.     Review of Systems  Constitutional:  Positive for malaise/fatigue. Negative for chills and fever.  HENT:  Positive for congestion, sinus pain and sore throat.   Respiratory:  Positive for cough. Negative for sputum production and shortness of breath.   Gastrointestinal:  Negative for constipation, nausea and vomiting.  Musculoskeletal:  Positive for myalgias.  Skin:  Negative for rash.  Neurological:  Negative for dizziness and headaches.   Negative, with the exception of above mentioned in HPI   Objective:  There were no vitals taken for this visit. There is no height or weight on file to calculate BMI. Physical Exam Vitals and nursing note reviewed.  Constitutional:      General: She is not in acute distress.    Appearance: Normal appearance. She is not ill-appearing or toxic-appearing.  HENT:     Head: Normocephalic and atraumatic.  Eyes:     General: No scleral icterus.       Right eye: No discharge.        Left eye: No discharge.     Conjunctiva/sclera: Conjunctivae normal.  Pulmonary:     Effort: Pulmonary effort is normal.     Comments: Cough present Musculoskeletal:     Cervical back: Normal range of motion.  Skin:    Findings: No rash.  Neurological:     Mental Status: She is alert and oriented to person, place, and time. Mental status is at baseline.  Psychiatric:        Mood and Affect: Mood normal.        Behavior: Behavior normal.        Thought Content: Thought content normal.        Judgment: Judgment normal.     No results found. No results found. Results for orders placed or performed in visit on 11/22/22 (from the past 24 hour(s))  POC COVID-19 BinaxNow     Status: Abnormal   Collection Time: 11/22/22  1:33 PM  Result Value Ref Range   SARS Coronavirus 2 Ag Positive  (A) Negative    Assessment/Plan: Sheri Kelly is a 72 y.o. female present for OV for  Acute cough/covid19 - POC COVID-19 BinaxNow> Positive Rest,  hydrate.  Start mucinex (DM if cough) Molnupiravir start F/U 1-2 weeks if not improved, sooner if worsening.   Reviewed expectations re: course of current medical issues. Discussed self-management of symptoms. Outlined signs and symptoms indicating need for more acute intervention. Patient verbalized understanding and all questions were answered. Patient received an After-Visit Summary.    Orders Placed This Encounter  Procedures   POC COVID-19 BinaxNow   Meds ordered this encounter  Medications   molnupiravir EUA (LAGEVRIO) 200 mg CAPS capsule    Sig: Take 4 capsules (800 mg total) by mouth 2 (two) times daily for 5 days.    Dispense:  40 capsule    Refill:  0   Referral Orders  No referral(s) requested today     Note is dictated utilizing voice recognition software. Although note has been proof read prior to signing, occasional typographical errors still can be missed. If any questions arise, please do not hesitate to call for verification.   electronically signed by:  Felix Pacini, DO  [facility] [place]

## 2022-11-22 NOTE — Patient Instructions (Addendum)
Return in about 2 weeks (around 12/06/2022), or if symptoms worsen or fail to improve.        Great to see you today.  I have refilled the medication(s) we provide.   If labs were collected or images ordered, we will inform you of  results once we have received them and reviewed. We will contact you either by echart message, or telephone call.  Please give ample time to the testing facility, and our office to run,  receive and review results. Please do not call inquiring of results, even if you can see them in your chart. We will contact you as soon as we are able. If it has been over 1 week since the test was completed, and you have not yet heard from [place], then please call us.    - echart message- for normal results that have been seen by the patient already.   - telephone call: abnormal results or if patient has not viewed results in their echart.  If a referral to a specialist was entered for you, please call us in 2 weeks if you have not heard from the specialist office to schedule.

## 2022-11-25 NOTE — Telephone Encounter (Signed)
There is only 1 dose available of molnupiravir.  Therefore likely Paxlovid was what was required on her formulary.  Unfortunately, now she is passed the recommended start time of the antiviral since she is now past 5 days of symptoms.  Have her monitor her symptoms and if symptoms do not continue to improve or start to worsen I would like to see her back in the office within a week

## 2022-11-28 ENCOUNTER — Encounter (INDEPENDENT_AMBULATORY_CARE_PROVIDER_SITE_OTHER): Payer: Self-pay

## 2022-12-03 ENCOUNTER — Ambulatory Visit
Admission: RE | Admit: 2022-12-03 | Discharge: 2022-12-03 | Disposition: A | Discharge status: Home / Self Care | Payer: Medicare HMO | Source: Ambulatory Visit | Attending: Family Medicine | Admitting: Family Medicine

## 2022-12-03 DIAGNOSIS — Z1231 Encounter for screening mammogram for malignant neoplasm of breast: Secondary | ICD-10-CM | POA: Diagnosis not present

## 2023-01-13 ENCOUNTER — Encounter: Payer: Self-pay | Admitting: Family Medicine

## 2023-01-13 ENCOUNTER — Ambulatory Visit (INDEPENDENT_AMBULATORY_CARE_PROVIDER_SITE_OTHER): Payer: Medicare HMO | Admitting: Family Medicine

## 2023-01-13 VITALS — BP 105/67 | HR 65 | Temp 98.0°F | Ht 68.0 in | Wt 166.2 lb

## 2023-01-13 DIAGNOSIS — Z0000 Encounter for general adult medical examination without abnormal findings: Secondary | ICD-10-CM | POA: Diagnosis not present

## 2023-01-13 DIAGNOSIS — E559 Vitamin D deficiency, unspecified: Secondary | ICD-10-CM | POA: Diagnosis not present

## 2023-01-13 DIAGNOSIS — Z1231 Encounter for screening mammogram for malignant neoplasm of breast: Secondary | ICD-10-CM

## 2023-01-13 DIAGNOSIS — Z23 Encounter for immunization: Secondary | ICD-10-CM | POA: Diagnosis not present

## 2023-01-13 LAB — TSH: TSH: 1.15 u[IU]/mL (ref 0.35–5.50)

## 2023-01-13 LAB — COMPREHENSIVE METABOLIC PANEL
ALT: 14 U/L (ref 0–35)
AST: 20 U/L (ref 0–37)
Albumin: 4.4 g/dL (ref 3.5–5.2)
Alkaline Phosphatase: 55 U/L (ref 39–117)
BUN: 22 mg/dL (ref 6–23)
CO2: 29 meq/L (ref 19–32)
Calcium: 10 mg/dL (ref 8.4–10.5)
Chloride: 106 meq/L (ref 96–112)
Creatinine, Ser: 0.82 mg/dL (ref 0.40–1.20)
GFR: 71.44 mL/min (ref >60.00)
Glucose, Bld: 84 mg/dL (ref 70–99)
Potassium: 4.6 meq/L (ref 3.5–5.1)
Sodium: 142 meq/L (ref 135–145)
Total Bilirubin: 0.4 mg/dL (ref 0.2–1.2)
Total Protein: 7.1 g/dL (ref 6.0–8.3)

## 2023-01-13 LAB — LIPID PANEL
Cholesterol: 251 mg/dL — ABNORMAL HIGH (ref 0–200)
HDL: 116.5 mg/dL (ref >39.00)
LDL Cholesterol: 124 mg/dL — ABNORMAL HIGH (ref 0–99)
NonHDL: 134.38
Total CHOL/HDL Ratio: 2
Triglycerides: 53 mg/dL (ref 0.0–149.0)
VLDL: 10.6 mg/dL (ref 0.0–40.0)

## 2023-01-13 LAB — CBC
HCT: 37 % (ref 36.0–46.0)
Hemoglobin: 11.8 g/dL — ABNORMAL LOW (ref 12.0–15.0)
MCHC: 31.9 g/dL (ref 30.0–36.0)
MCV: 85.9 fL (ref 78.0–100.0)
Platelets: 283 10*3/uL (ref 150.0–400.0)
RBC: 4.31 Mil/uL (ref 3.87–5.11)
RDW: 15.3 % (ref 11.5–15.5)
WBC: 3.4 10*3/uL — ABNORMAL LOW (ref 4.0–10.5)

## 2023-01-13 LAB — VITAMIN D 25 HYDROXY (VIT D DEFICIENCY, FRACTURES): VITD: 24.01 ng/mL — ABNORMAL LOW (ref 30.00–100.00)

## 2023-01-13 NOTE — Progress Notes (Signed)
Patient ID: Sheri Kelly, female  DOB: [birth year], 72 y.o.   MRN: [id number] Patient Care Team    Relationship Specialty Notifications Start End  Natalia Leatherwood, DO PCP - General Family Medicine  09/25/16   Osborn Coho, MD Consulting Physician Obstetrics and Gynecology  09/25/16   Ander Purpura, OD  Optometry  09/25/16    Comment: [facility]  Ollen Gross, MD Consulting Physician Orthopedic Surgery  09/26/17   Jeani Hawking, MD Consulting Physician Gastroenterology  07/14/20     Chief Complaint  Patient presents with   Annual Exam    Pt is not fasting    Subjective:  Sheri Kelly is a 72 y.o.  Female  present for CPE  All past medical history, surgical history, allergies, family history, immunizations, medications and social history were updated in the electronic medical record today. All recent labs, ED visits and hospitalizations within the last year were reviewed.  Health maintenance:  Colonoscopy: Dr.Hung, 10/2022, rpt 3 yrs Mammogram (50-74): Fhx present in mother, mammogram 11/2022, normal.> -BCGSO ordered Immunizations: tdap 09/2016.  PNA series completed.  Flu shot administered today.  Shingrix completed.  Infectious disease screening: Hep C completed Dexa: 11/03/2020> NORMAL. No further testing recommnedned.  Assistive device: none Oxygen [employer]:UXLK Patient has a Dental home. Hospitalizations/ED visits: reviewed      01/13/2023   10:28 AM 08/14/2022   11:23 AM 07/05/2021    2:02 PM 01/08/2021    1:10 PM 05/31/2020   11:23 AM  Depression screen PHQ 2/9  Decreased Interest 0 0 0 0 0  Down, Depressed, Hopeless 0 0 0 0 1  PHQ - 2 Score 0 0 0 0 1       No data to display          Immunization History  Administered Date(s) Administered   Fluad Quad(high Dose 65+) 01/08/2021, 01/09/2022   Fluad Trivalent(High Dose 65+) 01/13/2023   Influenza, High Dose Seasonal PF 02/24/2018   Influenza,inj,quad, With Preservative 11/13/2016    Influenza-Unspecified 12/15/2018   PFIZER(Purple Top)SARS-COV-2 Vaccination 05/07/2019, 05/28/2019, 12/14/2019, 02/24/2020   Pneumococcal Conjugate-13 09/25/2016   Pneumococcal Polysaccharide-23 10/02/2018   Tdap 09/13/2016   Zoster Recombinant(Shingrix) 11/29/2020    Past Medical History:  Diagnosis Date   Anemia 07/01/2019   Arthritis    Cervical dysplasia    Cervical polyp 01/14/2008   Complication of anesthesia    had ileus after hysterectomy 2011   History of breast reconstruction    Bilateral    History of chicken pox    History of measles, mumps, or rubella    Infertility, female    Menopausal symptoms    Sleep disturbance 12/03/2017   Trigger finger of all digits of right hand    RMF   Uterine fibroid    Allergies  Allergen Reactions   Dust Mite Extract    Molds & Smuts    Past Surgical History:  Procedure Laterality Date   ABDOMINAL HYSTERECTOMY  09/19/2009-09/22/2009   total   BREAST SURGERY  1998   "breast lift"   CESAREAN SECTION      x 2   INTRAOCULAR LENS INSERTION Bilateral    MOUTH SURGERY     SALPINGOOPHORECTOMY  09/19/2009   right   TRIGGER FINGER RELEASE Right 05/12/2017   Procedure: RIGHT TRIGGER FINGER RELEASE;  Surgeon: Louisa Second, MD;  Location: [facility];  Service: Plastics;  Laterality: Right;   TUBAL LIGATION     bilateral   UTERINE FIBROID  SURGERY     Family History  Problem Relation Age of Onset   Arthritis Mother    Breast cancer Mother    Lung cancer Father    Hypertension Brother    Throat cancer Brother    Leukemia Sister    Hypertension Son    Social History   Social History Narrative   Divorced. 1 child ([name]).   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    Drinks caffeine, uses herbal remedies. Takes a daily vitamin.    Wears seatbelt, bicycle helmet and dentures.    Smoke detector in the home. Firearms locked in the home.    Feels safe in her relationships.     Allergies as of 01/13/2023       Reactions    Dust Mite Extract    Molds & Smuts         Medication List        Accurate as of January 13, 2023 10:56 AM. If you have any questions, ask your nurse or doctor.          VITAMIN A PO Take by mouth.   VITAMIN C PO Take by mouth.   Vitamin D (Ergocalciferol) 1.25 MG (50000 UNIT) Caps capsule Commonly known as: DRISDOL Take 1 capsule (50,000 Units total) by mouth every 7 (seven) days.        All past medical history, surgical history, allergies, family history, immunizations andmedications were updated in the EMR today and reviewed under the history and medication portions of their EMR.      ROS 14 pt review of systems performed and negative (unless mentioned in an HPI)  Objective: BP 105/67   Pulse 65   Temp 98 F (36.7 C)   Ht 5\' 8"  (1.727 m)   Wt 166 lb 3.2 oz (75.4 kg)   SpO2 99%   BMI 25.27 kg/m  Physical Exam Vitals and nursing note reviewed.  Constitutional:      General: She is not in acute distress.    Appearance: Normal appearance. She is not ill-appearing or toxic-appearing.  HENT:     Head: Normocephalic and atraumatic.     Right Ear: Tympanic membrane, ear canal and external ear normal. There is no impacted cerumen.     Left Ear: Tympanic membrane, ear canal and external ear normal. There is no impacted cerumen.     Nose: No congestion or rhinorrhea.     Mouth/Throat:     Mouth: Mucous membranes are moist.     Pharynx: Oropharynx is clear. No oropharyngeal exudate or posterior oropharyngeal erythema.  Eyes:     General: No scleral icterus.       Right eye: No discharge.        Left eye: No discharge.     Extraocular Movements: Extraocular movements intact.     Conjunctiva/sclera: Conjunctivae normal.     Pupils: Pupils are equal, round, and reactive to light.  Cardiovascular:     Rate and Rhythm: Normal rate and regular rhythm.     Pulses: Normal pulses.     Heart sounds: Normal heart sounds. No murmur heard.    No friction rub. No  gallop.  Pulmonary:     Effort: Pulmonary effort is normal. No respiratory distress.     Breath sounds: Normal breath sounds. No stridor. No wheezing, rhonchi or rales.  Chest:     Chest wall: No tenderness.  Abdominal:     General: Abdomen is flat. Bowel sounds are normal. There is  no distension.     Palpations: Abdomen is soft. There is no mass.     Tenderness: There is no abdominal tenderness. There is no right CVA tenderness, left CVA tenderness, guarding or rebound.     Hernia: No hernia is present.  Musculoskeletal:        General: No swelling, tenderness or deformity. Normal range of motion.     Cervical back: Normal range of motion and neck supple. No rigidity or tenderness.     Right lower leg: No edema.     Left lower leg: No edema.  Lymphadenopathy:     Cervical: No cervical adenopathy.  Skin:    General: Skin is warm and dry.     Coloration: Skin is not jaundiced or pale.     Findings: No bruising, erythema, lesion or rash.  Neurological:     General: No focal deficit present.     Mental Status: She is alert and oriented to person, place, and time. Mental status is at baseline.     Cranial Nerves: No cranial nerve deficit.     Sensory: No sensory deficit.     Motor: No weakness.     Coordination: Coordination normal.     Gait: Gait normal.     Deep Tendon Reflexes: Reflexes normal.  Psychiatric:        Mood and Affect: Mood normal.        Behavior: Behavior normal.        Thought Content: Thought content normal.        Judgment: Judgment normal.      No results found.  Assessment/plan: Lucilla Petrenko is a 72 y.o. female present for CPE  Vitamin D deficiency Vitamin D collect today Continue supplement Influenza vaccine needed - Flu Vaccine Trivalent High Dose (Fluad) Breast cancer screening by mammogram -MM 3D SCREENING MAMMOGRAM BILATERAL BREAST; Future  Routine general medical examination at a health care facility Patient was encouraged to exercise  greater than 150 minutes a week. Patient was encouraged to choose a diet filled with fresh fruits and vegetables, and lean meats. AVS provided to patient today for education/recommendation on gender specific health and safety maintenance. Colonoscopy: no fhx, Dr.Hung, 10/2022, rpt 3 yrs Mammogram (50-74): Fhx present in mother, mammogram 11/2022, normal.> -BCGSO ordered Immunizations: tdap 09/2016.  PNA series completed.  Flu shot administered today.  Shingrix completed.  Infectious disease screening: Hep C completed Dexa: 11/03/2020> NORMAL. No further testing recommnedned.  - TSH - Lipid panel - CBC  Return in about 1 year (around 01/14/2024) for cpe (20 min).  Orders Placed This Encounter  Procedures   MM 3D SCREENING MAMMOGRAM BILATERAL BREAST   Flu Vaccine Trivalent High Dose (Fluad)   Comprehensive metabolic panel   TSH   Lipid panel   CBC   Vitamin D (25 hydroxy)   No orders of the defined types were placed in this encounter.  Referral Orders  No referral(s) requested today     Electronically signed by: Felix Pacini, DO [facility] [place]

## 2023-01-13 NOTE — Patient Instructions (Addendum)
Return in about 1 year (around 01/14/2024) for cpe (20 min).        Great to see you today.  I have refilled the medication(s) we provide.   If labs were collected or images ordered, we will inform you of  results once we have received them and reviewed. We will contact you either by echart message, or telephone call.  Please give ample time to the testing facility, and our office to run,  receive and review results. Please do not call inquiring of results, even if you can see them in your chart. We will contact you as soon as we are able. If it has been over 1 week since the test was completed, and you have not yet heard from [place], then please call us.    - echart message- for normal results that have been seen by the patient already.   - telephone call: abnormal results or if patient has not viewed results in their echart.  If a referral to a specialist was entered for you, please call us in 2 weeks if you have not heard from the specialist office to schedule.

## 2023-07-09 ENCOUNTER — Ambulatory Visit (INDEPENDENT_AMBULATORY_CARE_PROVIDER_SITE_OTHER): Admitting: Family Medicine

## 2023-07-09 ENCOUNTER — Encounter: Payer: Self-pay | Admitting: Family Medicine

## 2023-07-09 VITALS — BP 122/72 | HR 65 | Temp 98.3°F | Wt 175.6 lb

## 2023-07-09 DIAGNOSIS — H6991 Unspecified Eustachian tube disorder, right ear: Secondary | ICD-10-CM | POA: Diagnosis not present

## 2023-07-09 MED ORDER — FLUTICASONE PROPIONATE 50 MCG/ACT NA SUSP: 1.0000 | Freq: Two times a day (BID) | NASAL | 6 refills | Status: DC

## 2023-07-09 NOTE — Patient Instructions (Signed)
 Eustachian Tube Dysfunction  Eustachian tube dysfunction refers to a condition in which a blockage develops in the narrow passage that connects the middle ear to the back of the nose (eustachian tube). The eustachian tube regulates air pressure in the middle ear by letting air move between the ear and nose. It also helps to drain fluid from the middle ear space. Eustachian tube dysfunction can affect one or both ears. When the eustachian tube does not function properly, air pressure, fluid, or both can build up in the middle ear. What are the causes? This condition occurs when the eustachian tube becomes blocked or cannot open normally. Common causes of this condition include: Ear infections. Colds and other infections that affect the nose, mouth, and throat (upper respiratory tract). Allergies. Irritation from cigarette smoke. Irritation from stomach acid coming up into the esophagus (gastroesophageal reflux). The esophagus is the part of the body that moves food from the mouth to the stomach. Sudden changes in air pressure, such as from descending in an airplane or scuba diving. Abnormal growths in the nose or throat, such as: Growths that line the nose (nasal polyps). Abnormal growth of cells (tumors). Enlarged tissue at the back of the throat (adenoids). What increases the risk? You are more likely to develop this condition if: You smoke. You are overweight. You are a child who has: Certain birth defects of the mouth, such as cleft palate. Large tonsils or adenoids. What are the signs or symptoms? Common symptoms of this condition include: A feeling of fullness in the ear. Ear pain. Clicking or popping noises in the ear. Ringing in the ear (tinnitus). Hearing loss. Loss of balance. Dizziness. Symptoms may get worse when the air pressure around you changes, such as when you travel to an area of high elevation, fly on an airplane, or go scuba diving. How is this diagnosed? This  condition may be diagnosed based on: Your symptoms. A physical exam of your ears, nose, and throat. Tests, such as those that measure: The movement of your eardrum. Your hearing (audiometry). How is this treated? Treatment depends on the cause and severity of your condition. In mild cases, you may relieve your symptoms by moving air into your ears. This is called "popping the ears." In more severe cases, or if you have symptoms of fluid in your ears, treatment may include: Medicines to relieve congestion (decongestants). Medicines that treat allergies (antihistamines). Nasal sprays or ear drops that contain medicines that reduce swelling (steroids). A procedure to drain the fluid in your eardrum. In this procedure, a small tube may be placed in the eardrum to: Drain the fluid. Restore the air in the middle ear space. A procedure to insert a balloon device through the nose to inflate the opening of the eustachian tube (balloon dilation). Follow these instructions at home: Lifestyle Do not do any of the following until your health care provider approves: Travel to high altitudes. Fly in airplanes. Work in a Estate agent or room. Scuba dive. Do not use any products that contain nicotine or tobacco. These products include cigarettes, chewing tobacco, and vaping devices, such as e-cigarettes. If you need help quitting, ask your health care provider. Keep your ears dry. Wear fitted earplugs during showering and bathing. Dry your ears completely after. General instructions Take over-the-counter and prescription medicines only as told by your health care provider. Use techniques to help pop your ears as recommended by your health care provider. These may include: Chewing gum. Yawning. Frequent, forceful swallowing.  Closing your mouth, holding your nose closed, and gently blowing as if you are trying to blow air out of your nose. Keep all follow-up visits. This is important. Contact a  health care provider if: Your symptoms do not go away after treatment. Your symptoms come back after treatment. You are unable to pop your ears. You have: A fever. Pain in your ear. Pain in your head or neck. Fluid draining from your ear. Your hearing suddenly changes. You become very dizzy. You lose your balance. Get help right away if: You have a sudden, severe increase in any of your symptoms. Summary Eustachian tube dysfunction refers to a condition in which a blockage develops in the eustachian tube. It can be caused by ear infections, allergies, inhaled irritants, or abnormal growths in the nose or throat. Symptoms may include ear pain or fullness, hearing loss, or ringing in the ears. Mild cases are treated with techniques to unblock the ears, such as yawning or chewing gum. More severe cases are treated with medicines or procedures. This information is not intended to replace advice given to you by your health care provider. Make sure you discuss any questions you have with your health care provider. Document Revised: 06/12/2020 Document Reviewed: 06/12/2020 Elsevier Patient Education  2024 [employer].

## 2023-07-09 NOTE — Progress Notes (Signed)
 Sheri Kelly , 12-Oct-1950, 73 y.o., female MRN: [id number] Patient Care Team    Relationship Specialty Notifications Start End  Natalia Leatherwood, DO PCP - General Family Medicine  09/25/16   Osborn Coho, MD Consulting Physician Obstetrics and Gynecology  09/25/16   Ander Purpura, OD  Optometry  09/25/16    Comment: [facility]  Ollen Gross, MD Consulting Physician Orthopedic Surgery  09/26/17   Jeani Hawking, MD Consulting Physician Gastroenterology  07/14/20     Chief Complaint  Patient presents with   Ear Fullness    A few months; fullness in the R ear. Denies pain. But has been taking antihistamines.      Subjective: Sheri Kelly is a 73 y.o. Pt presents for an OV with complaints of right ear fullness, feels like fluid of months duration.  Associated symptoms include nothing. She denies tinnitus or decrease hearing.  Pt has tried OTC antihistamine to ease their symptoms.      01/13/2023   10:28 AM 08/14/2022   11:23 AM 07/05/2021    2:02 PM 01/08/2021    1:10 PM 05/31/2020   11:23 AM  Depression screen PHQ 2/9  Decreased Interest 0 0 0 0 0  Down, Depressed, Hopeless 0 0 0 0 1  PHQ - 2 Score 0 0 0 0 1    Allergies  Allergen Reactions   Dust Mite Extract    Molds & Smuts    Social History   Social History Narrative   Divorced. 1 child ([name]).   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    Drinks caffeine, uses herbal remedies. Takes a daily vitamin.    Wears seatbelt, bicycle helmet and dentures.    Smoke detector in the home. Firearms locked in the home.    Feels safe in her relationships.    Past Medical History:  Diagnosis Date   Anemia 07/01/2019   Arthritis    Cervical dysplasia    Cervical polyp 01/14/2008   Complication of anesthesia    had ileus after hysterectomy 2011   History of breast reconstruction    Bilateral    History of chicken pox    History of measles, mumps, or rubella    Infertility, female    Menopausal symptoms    Sleep  disturbance 12/03/2017   Trigger finger of all digits of right hand    RMF   Uterine fibroid    Past Surgical History:  Procedure Laterality Date   ABDOMINAL HYSTERECTOMY  09/19/2009-09/22/2009   total   BREAST SURGERY  1998   "breast lift"   CESAREAN SECTION      x 2   INTRAOCULAR LENS INSERTION Bilateral    MOUTH SURGERY     SALPINGOOPHORECTOMY  09/19/2009   right   TRIGGER FINGER RELEASE Right 05/12/2017   Procedure: RIGHT TRIGGER FINGER RELEASE;  Surgeon: Louisa Second, MD;  Location: [facility];  Service: Plastics;  Laterality: Right;   TUBAL LIGATION     bilateral   UTERINE FIBROID SURGERY     Family History  Problem Relation Age of Onset   Arthritis Mother    Breast cancer Mother    Lung cancer Father    Hypertension Brother    Throat cancer Brother    Leukemia Sister    Hypertension Son    Allergies as of 07/09/2023       Reactions   Dust Mite Extract    Molds & Smuts  Medication List        Accurate as of July 09, 2023 10:21 AM. If you have any questions, ask your nurse or doctor.          VITAMIN A PO Take by mouth.   VITAMIN C PO Take by mouth.   Vitamin D (Ergocalciferol) 1.25 MG (50000 UNIT) Caps capsule Commonly known as: DRISDOL Take 1 capsule (50,000 Units total) by mouth every 7 (seven) days.        All past medical history, surgical history, allergies, family history, immunizations andmedications were updated in the EMR today and reviewed under the history and medication portions of their EMR.     ROS Negative, with the exception of above mentioned in HPI   Objective:  BP 122/72   Pulse 65   Temp 98.3 F (36.8 C)   Wt 175 lb 9.6 oz (79.7 kg)   SpO2 97%   BMI 26.70 kg/m  Body mass index is 26.7 kg/m.  Physical Exam Vitals and nursing note reviewed.  Constitutional:      General: She is not in acute distress.    Appearance: Normal appearance. She is normal weight. She is not ill-appearing or  toxic-appearing.  HENT:     Head: Normocephalic and atraumatic.     Right Ear: Tympanic membrane, ear canal and external ear normal. There is no impacted cerumen.     Left Ear: Tympanic membrane, ear canal and external ear normal. There is no impacted cerumen.     Ears:     Comments: Mild effusion present bilateral    Nose: No congestion or rhinorrhea.     Mouth/Throat:     Mouth: Mucous membranes are moist.  Eyes:     General: No scleral icterus.       Right eye: No discharge.        Left eye: No discharge.     Extraocular Movements: Extraocular movements intact.     Conjunctiva/sclera: Conjunctivae normal.     Pupils: Pupils are equal, round, and reactive to light.  Skin:    Findings: No rash.  Neurological:     Mental Status: She is alert and oriented to person, place, and time. Mental status is at baseline.     Motor: No weakness.     Coordination: Coordination normal.     Gait: Gait normal.  Psychiatric:        Mood and Affect: Mood normal.        Behavior: Behavior normal.        Thought Content: Thought content normal.        Judgment: Judgment normal.      No results found. No results found. No results found for this or any previous visit (from the past 24 hours).  Assessment/Plan: Anjelica Gorniak is a 73 y.o. female present for OV for  1. Eustachian tube disorder, right (Primary) Flonase nasal spray BID Start OTC allegra F/u prn  Reviewed expectations re: course of current medical issues. Discussed self-management of symptoms. Outlined signs and symptoms indicating need for more acute intervention. Patient verbalized understanding and all questions were answered. Patient received an After-Visit Summary.    No orders of the defined types were placed in this encounter.  No orders of the defined types were placed in this encounter.  Referral Orders  No referral(s) requested today     Note is dictated utilizing voice recognition software. Although note  has been proof read prior to signing, occasional typographical errors still can be  missed. If any questions arise, please do not hesitate to call for verification.   electronically signed by:  Felix Pacini, DO  [facility] - [place]

## 2023-07-21 ENCOUNTER — Ambulatory Visit (INDEPENDENT_AMBULATORY_CARE_PROVIDER_SITE_OTHER): Admitting: Family Medicine

## 2023-07-21 ENCOUNTER — Encounter: Payer: Self-pay | Admitting: Family Medicine

## 2023-07-21 VITALS — BP 132/72 | HR 68 | Temp 98.1°F | Wt 175.0 lb

## 2023-07-21 DIAGNOSIS — I517 Cardiomegaly: Secondary | ICD-10-CM | POA: Diagnosis not present

## 2023-07-21 DIAGNOSIS — Z8249 Family history of ischemic heart disease and other diseases of the circulatory system: Secondary | ICD-10-CM | POA: Diagnosis not present

## 2023-07-21 NOTE — Patient Instructions (Addendum)

## 2023-07-21 NOTE — Progress Notes (Signed)
 [name] , 04/09/1951, 73 y.o., female MRN: [id number] Patient Care Team    Relationship Specialty Notifications Start End  Natalia Leatherwood, DO PCP - General Family Medicine  09/25/16   Osborn Coho, MD Consulting Physician Obstetrics and Gynecology  09/25/16   Ander Purpura, OD  Optometry  09/25/16    Comment: [facility]  Ollen Gross, MD Consulting Physician Orthopedic Surgery  09/26/17   Jeani Hawking, MD Consulting Physician Gastroenterology  07/14/20     Chief Complaint  Patient presents with   Heart Problem    Pt has family hx of heart disease, requesting labs to check heart enzymes.      Subjective: Sheri Kelly is a 73 y.o. Pt presents for an OV with concerns of heart disease.  Her cholesterol has been good over the years.  She has not had BP issues.  CTA showed a mildly enlarged heart 2023.  Patient denies chest pain, shortness of breath, dizziness or lower extremity edema.  She has a fhx heart disease and wonders if there is enzymes or a screening to help her understand her heart health.    07/21/2023    9:01 AM 01/13/2023   10:28 AM 08/14/2022   11:23 AM 07/05/2021    2:02 PM 01/08/2021    1:10 PM  Depression screen PHQ 2/9  Decreased Interest 0 0 0 0 0  Down, Depressed, Hopeless 0 0 0 0 0  PHQ - 2 Score 0 0 0 0 0  Altered sleeping 0      Tired, decreased energy 0      Change in appetite 0      Feeling bad or failure about yourself  0      Trouble concentrating 0      Moving slowly or fidgety/restless 0      Suicidal thoughts 0      PHQ-9 Score 0      Difficult doing work/chores Not difficult at all        Allergies  Allergen Reactions   Dust Mite Extract    Molds & Smuts    Social History   Social History Narrative   Divorced. 1 child (Sheri Kelly).   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    Drinks caffeine, uses herbal remedies. Takes a daily vitamin.    Wears seatbelt, bicycle helmet and dentures.    Smoke detector in the home. Firearms  locked in the home.    Feels safe in her relationships.    Past Medical History:  Diagnosis Date   Anemia 07/01/2019   Arthritis    Cervical dysplasia    Cervical polyp 01/14/2008   Complication of anesthesia    had ileus after hysterectomy 2011   History of breast reconstruction    Bilateral    History of chicken pox    History of measles, mumps, or rubella    Infertility, female    Menopausal symptoms    Sleep disturbance 12/03/2017   Trigger finger of all digits of right hand    RMF   Uterine fibroid    Past Surgical History:  Procedure Laterality Date   ABDOMINAL HYSTERECTOMY  09/19/2009-09/22/2009   total   BREAST SURGERY  1998   "breast lift"   CESAREAN SECTION      x 2   INTRAOCULAR LENS INSERTION Bilateral    MOUTH SURGERY     SALPINGOOPHORECTOMY  09/19/2009   right   TRIGGER FINGER RELEASE Right 05/12/2017  Procedure: RIGHT TRIGGER FINGER RELEASE;  Surgeon: Louisa Second, MD;  Location: [facility];  Service: Plastics;  Laterality: Right;   TUBAL LIGATION     bilateral   UTERINE FIBROID SURGERY     Family History  Problem Relation Age of Onset   Arthritis Mother    Breast cancer Mother    Lung cancer Father    Hypertension Brother    Throat cancer Brother    Leukemia Sister    Hypertension Son    Allergies as of 07/21/2023       Reactions   Dust Mite Extract    Molds & Smuts         Medication List        Accurate as of July 21, 2023  9:05 AM. If you have any questions, ask your nurse or doctor.          fluticasone 50 MCG/ACT nasal spray Commonly known as: FLONASE Place 1 spray into both nostrils 2 (two) times daily.   VITAMIN A PO Take by mouth.   VITAMIN C PO Take by mouth.   Vitamin D (Ergocalciferol) 1.25 MG (50000 UNIT) Caps capsule Commonly known as: DRISDOL Take 1 capsule (50,000 Units total) by mouth every 7 (seven) days.        All past medical history, surgical history, allergies, family history,  immunizations andmedications were updated in the EMR today and reviewed under the history and medication portions of their EMR.     ROS Negative, with the exception of above mentioned in HPI   Objective:  BP 132/72   Pulse 68   Temp 98.1 F (36.7 C)   Wt 175 lb (79.4 kg)   SpO2 96%   BMI 26.61 kg/m  Body mass index is 26.61 kg/m. Physical Exam Vitals and nursing note reviewed.  Constitutional:      General: She is not in acute distress.    Appearance: Normal appearance. She is not ill-appearing, toxic-appearing or diaphoretic.  HENT:     Head: Normocephalic and atraumatic.  Eyes:     General: No scleral icterus.       Right eye: No discharge.        Left eye: No discharge.     Extraocular Movements: Extraocular movements intact.     Conjunctiva/sclera: Conjunctivae normal.     Pupils: Pupils are equal, round, and reactive to light.  Cardiovascular:     Rate and Rhythm: Normal rate and regular rhythm.     Heart sounds: No murmur heard.    No friction rub. No gallop.  Pulmonary:     Effort: Pulmonary effort is normal. No respiratory distress.     Breath sounds: Normal breath sounds. No wheezing, rhonchi or rales.  Musculoskeletal:     Right lower leg: No edema.     Left lower leg: No edema.  Skin:    General: Skin is warm.     Findings: No rash.  Neurological:     Mental Status: She is alert and oriented to person, place, and time. Mental status is at baseline.     Motor: No weakness.     Gait: Gait normal.  Psychiatric:        Mood and Affect: Mood normal.        Behavior: Behavior normal.        Thought Content: Thought content normal.        Judgment: Judgment normal.      No results found. No results found.  No results found for this or any previous visit (from the past 24 hours).  Assessment/Plan: [name] is a 73 y.o. female present for OV for  Family history of heart disease (Primary) Enlarged heart Mild enlargement on CTA 2023 to r/o PE in ED.  Asymptomatic. Cholesterol and BP have always been good.  - CT CARDIAC SCORING (SELF PAY ONLY); Future  Reviewed expectations re: course of current medical issues. Discussed self-management of symptoms. Outlined signs and symptoms indicating need for more acute intervention. Patient verbalized understanding and all questions were answered. Patient received an After-Visit Summary.    No orders of the defined types were placed in this encounter.  No orders of the defined types were placed in this encounter.  Referral Orders  No referral(s) requested today     Note is dictated utilizing voice recognition software. Although note has been proof read prior to signing, occasional typographical errors still can be missed. If any questions arise, please do not hesitate to call for verification.   electronically signed by:  Felix Pacini, DO  [facility] [place]

## 2023-08-20 ENCOUNTER — Ambulatory Visit: Payer: Medicare HMO

## 2023-08-20 VITALS — BP 132/72 | Ht 68.0 in | Wt 174.0 lb

## 2023-08-20 DIAGNOSIS — Z0000 Encounter for general adult medical examination without abnormal findings: Secondary | ICD-10-CM

## 2023-08-20 NOTE — Patient Instructions (Signed)
 [name] , Thank you for taking time to come for your Medicare Wellness Visit. I appreciate your ongoing commitment to your health goals. Please review the following plan we discussed and let me know if I can assist you in the future.   Referrals/Orders/Follow-Ups/Clinician Recommendations: follow up as needed for next AWV  This is a list of the screening recommended for you and due dates:  Health Maintenance  Topic Date Due   Flu Shot  11/14/2023   Medicare Annual Wellness Visit  08/19/2024   Mammogram  12/02/2024   Colon Cancer Screening  10/30/2025   DTaP/Tdap/Td vaccine (2 - Td or Tdap) 09/14/2026   Pneumonia Vaccine  Completed   DEXA scan (bone density measurement)  Completed   Hepatitis C Screening  Completed   HPV Vaccine  Aged Out   Meningitis B Vaccine  Aged Out   COVID-19 Vaccine  Discontinued   Zoster (Shingles) Vaccine  Discontinued    Advanced directives: (Copy Requested) Please bring a copy of your health care power of attorney and living will to the office to be added to your chart at your convenience. You can mail to [facility] [address] [place], [place] [address] or email to [email or web address]  Next Medicare Annual Wellness Visit scheduled for next year: Yes  Have you seen your provider in the last 6 months (3 months if uncontrolled diabetes)? Yes

## 2023-08-20 NOTE — Progress Notes (Signed)
 Because this visit was a virtual/telehealth visit,  certain criteria was not obtained, such a blood pressure, CBG if applicable, and timed get up and go. Any medications not marked as "taking" were not mentioned during the medication reconciliation part of the visit. Any vitals not documented were not able to be obtained due to this being a telehealth visit or patient was unable to self-report a recent blood pressure reading due to a lack of equipment at home via telehealth. Vitals that have been documented are verbally provided by the patient.   This visit was performed by a medical professional under my direct supervision. I was immediately available for consultation/collaboration. I have reviewed and agree with the Annual Wellness Visit documentation.  Subjective:   Sheri Kelly is a 74 y.o. who presents for a Medicare Wellness preventive visit.  Visit Complete: Virtual I connected with  Sheri Kelly on 08/20/23 by a audio enabled telemedicine application and verified that I am speaking with the correct person using two identifiers.  Patient Location: Home  Provider Location: Home Office  I discussed the limitations of evaluation and management by telemedicine. The patient expressed understanding and agreed to proceed.  Vital Signs: Because this visit was a virtual/telehealth visit, some criteria may be missing or patient reported. Any vitals not documented were not able to be obtained and vitals that have been documented are patient reported.  VideoDeclined- This patient declined Librarian, academic. Therefore the visit was completed with audio only.  Persons Participating in Visit: Patient.  AWV Questionnaire: No: Patient Medicare AWV questionnaire was not completed prior to this visit.  Cardiac Risk Factors include: advanced age (>98men, >31 women)     Objective:    Today's Vitals   08/20/23 1025  BP: 132/72  Weight: 174 lb (78.9 kg)  Height: 5\' 8"   (1.727 m)   Body mass index is 26.46 kg/m.     08/20/2023   10:24 AM 08/14/2022   11:24 AM 01/26/2022   11:41 PM 07/05/2021    2:03 PM 05/31/2020   11:21 AM 10/02/2018    1:05 PM 09/26/2017   10:23 AM  Advanced Directives  Does Patient Have a Medical Advance Directive? Yes Yes No Yes Yes Yes Yes  Type of Estate agent of [place];Living will Healthcare Power of [place];Living will  Living will;Healthcare Power of [employer] Power of [place];Living will Healthcare Power of [place];Living will Living will;Healthcare Power of Attorney  Does patient want to make changes to medical advance directive? No - Patient declined   No - Patient declined     Copy of Healthcare Power of Attorney in Chart? No - copy requested No - copy requested   No - copy requested No - copy requested No - copy requested  Would patient like information on creating a medical advance directive?   No - Patient declined        Current Medications (verified) Outpatient Encounter Medications as of 08/20/2023  Medication Sig   Ascorbic Acid (VITAMIN C PO) Take by mouth.   fluticasone  (FLONASE ) 50 MCG/ACT nasal spray Place 1 spray into both nostrils 2 (two) times daily.   VITAMIN A PO Take by mouth.   Vitamin D , Ergocalciferol , (DRISDOL ) 1.25 MG (50000 UNIT) CAPS capsule Take 1 capsule (50,000 Units total) by mouth every 7 (seven) days.   No facility-administered encounter medications on file as of 08/20/2023.    Allergies (verified) Dust mite extract and Molds & smuts   History: Past Medical History:  Diagnosis Date   Anemia 07/01/2019   Arthritis    Cervical dysplasia    Cervical polyp 01/14/2008   Complication of anesthesia    had ileus after hysterectomy 2011   History of breast reconstruction    Bilateral    History of chicken pox    History of measles, mumps, or rubella    Infertility, female    Menopausal symptoms    Sleep disturbance 12/03/2017   Trigger finger of all digits  of right hand    RMF   Uterine fibroid    Past Surgical History:  Procedure Laterality Date   ABDOMINAL HYSTERECTOMY  09/19/2009-09/22/2009   total   BREAST SURGERY  1998   "breast lift"   CESAREAN SECTION      x 2   INTRAOCULAR LENS INSERTION Bilateral    MOUTH SURGERY     SALPINGOOPHORECTOMY  09/19/2009   right   TRIGGER FINGER RELEASE Right 05/12/2017   Procedure: RIGHT TRIGGER FINGER RELEASE;  Surgeon: Phyllis Breeze, MD;  Location: [facility];  Service: Plastics;  Laterality: Right;   TUBAL LIGATION     bilateral   UTERINE FIBROID SURGERY     Family History  Problem Relation Age of Onset   Arthritis Mother    Breast cancer Mother    Lung cancer Father    Hypertension Brother    Throat cancer Brother    Leukemia Sister    Hypertension Son    Social History   Socioeconomic History   Marital status: Divorced    Spouse name: Not on file   Number of children: 1   Years of education: 16   Highest education level: Not on file  Occupational History   Occupation: Psychologist, sport and exercise- [employer]  Tobacco Use   Smoking status: Former    Current packs/day: 0.00    Types: Cigarettes    Quit date: 09/11/2009    Years since quitting: 13.9   Smokeless tobacco: Never   Tobacco comments:    socially only for a few years  Vaping Use   Vaping status: Never Used  Substance and Sexual Activity   Alcohol use: Yes    Alcohol/week: 10.0 standard drinks of alcohol    Types: 10 Glasses of wine per week    Comment: social   Drug use: No   Sexual activity: Never  Other Topics Concern   Not on file  Social History Narrative   Divorced. 1 child ([name]).   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    Drinks caffeine, uses herbal remedies. Takes a daily vitamin.    Wears seatbelt, bicycle helmet and dentures.    Smoke detector in the home. Firearms locked in the home.    Feels safe in her relationships.    Social Drivers of Corporate investment banker Strain: Low Risk   (08/20/2023)   Overall Financial Resource Strain (CARDIA)    Difficulty of Paying Living Expenses: Not hard at all  Food Insecurity: No Food Insecurity (08/20/2023)   Hunger Vital Sign    Worried About Running Out of Food in the Last Year: Never true    Ran Out of Food in the Last Year: Never true  Transportation Needs: No Transportation Needs (08/20/2023)   PRAPARE - Administrator, Civil Service (Medical): No    Lack of Transportation (Non-Medical): No  Physical Activity: Sufficiently Active (08/20/2023)   Exercise Vital Sign    Days of Exercise per Week: 4 days  Minutes of Exercise per Session: 40 min  Stress: No Stress Concern Present (08/20/2023)   [employer] of Occupational Health - Occupational Stress Questionnaire    Feeling of Stress : Only a little  Social Connections: Moderately Integrated (08/20/2023)   Social Connection and Isolation Panel [NHANES]    Frequency of Communication with Friends and Family: More than three times a week    Frequency of Social Gatherings with Friends and Family: More than three times a week    Attends Religious Services: More than 4 times per year    Active Member of Clubs or Organizations: No    Attends Engineer, structural: 1 to 4 times per year    Marital Status: Divorced    Tobacco Counseling Counseling given: Not Answered Tobacco comments: socially only for a few years    Clinical Intake:  Pre-visit preparation completed: Yes  Pain : No/denies pain     BMI - recorded: 26.46 Nutritional Status: BMI 25 -29 Overweight Nutritional Risks: Other (Comment) Diabetes: No  Lab Results  Component Value Date   HGBA1C 6.0 01/09/2022   HGBA1C 5.8 01/08/2021   HGBA1C 5.7 07/01/2019     How often do you need to have someone help you when you read instructions, pamphlets, or other written materials from your doctor or pharmacy?: 1 - Never What is the last grade level you completed in school?: 4 year  degree  Interpreter Needed?: No  Information entered by :: Juliann Ochoa   Activities of Daily Living     08/20/2023   10:28 AM  In your present state of health, do you have any difficulty performing the following activities:  Hearing? 0  Vision? 0  Comment sometimes  Difficulty concentrating or making decisions? 0  Walking or climbing stairs? 0  Dressing or bathing? 0  Doing errands, shopping? 0  Preparing Food and eating ? N  Using the Toilet? N  In the past six months, have you accidently leaked urine? Y  Comment sometimes  Do you have problems with loss of bowel control? N  Managing your Medications? N  Managing your Finances? N  Housekeeping or managing your Housekeeping? N    Patient Care Team: Mariel Shope, DO as PCP - General (Family Medicine) Renea Carrion, MD as Consulting Physician (Obstetrics and Gynecology) Dorean Gambles, OD (Optometry) Liliane Rei, MD as Consulting Physician (Orthopedic Surgery) Alvis Jourdain, MD as Consulting Physician (Gastroenterology)  Indicate any recent Medical Services you may have received from other than [facility] providers in the past year (date may be approximate).     Assessment:   This is a routine wellness examination for Sheri.  Hearing/Vision screen Hearing Screening - Comments:: No hearing difficulties Vision Screening - Comments:: Wears glasses   Goals Addressed             This Visit's Progress    Patient Stated   On track    Stop stressing over things I can't control       Depression Screen     08/20/2023   10:30 AM 07/21/2023    9:01 AM 01/13/2023   10:28 AM 08/14/2022   11:23 AM 07/05/2021    2:02 PM 01/08/2021    1:10 PM 05/31/2020   11:23 AM  PHQ 2/9 Scores  PHQ - 2 Score 2 0 0 0 0 0 1  PHQ- 9 Score 3 0         Fall Risk     08/20/2023   10:28 AM 07/21/2023  9:01 AM 01/13/2023   10:28 AM 08/14/2022   11:25 AM 07/05/2021    2:03 PM  Fall Risk   Falls in the past year? 0 0 0 0 0  Number falls  in past yr: 0  0 0 0  Injury with Fall? 0  0 0 0  Risk for fall due to : No Fall Risks  No Fall Risks Impaired vision   Follow up Falls prevention discussed;Falls evaluation completed Falls evaluation completed Falls evaluation completed Falls prevention discussed     MEDICARE RISK AT HOME:  Medicare Risk at Home Any stairs in or around the home?: Yes If so, are there any without handrails?: No Home free of loose throw rugs in walkways, pet beds, electrical cords, etc?: Yes Adequate lighting in your home to reduce risk of falls?: Yes Life alert?: No Use of a cane, walker or w/c?: No Grab bars in the bathroom?: Yes Shower chair or bench in shower?: Yes Elevated toilet seat or a handicapped toilet?: Yes  TIMED UP AND GO:  Was the test performed?  No  Cognitive Function: 6CIT completed        08/20/2023   10:26 AM 08/14/2022   11:26 AM 07/05/2021    2:06 PM  6CIT Screen  What Year? 0 points 0 points 0 points  What month? 0 points 0 points 0 points  What time? 0 points 0 points 0 points  Count back from 20 0 points 0 points 0 points  Months in reverse 0 points 0 points 0 points  Repeat phrase 0 points 0 points 0 points  Total Score 0 points 0 points 0 points    Immunizations Immunization History  Administered Date(s) Administered   Fluad Quad(high Dose 65+) 01/08/2021, 01/09/2022   Fluad Trivalent(High Dose 65+) 01/13/2023   Influenza, High Dose Seasonal PF 02/24/2018   Influenza,inj,quad, With Preservative 11/13/2016   Influenza-Unspecified 12/15/2018   PFIZER(Purple Top)SARS-COV-2 Vaccination 05/07/2019, 05/28/2019, 12/14/2019, 02/24/2020   Pneumococcal Conjugate-13 09/25/2016   Pneumococcal Polysaccharide-23 10/02/2018   Tdap 09/13/2016   Zoster Recombinant(Shingrix) 11/29/2020    Screening Tests Health Maintenance  Topic Date Due   INFLUENZA VACCINE  11/14/2023   Medicare Annual Wellness (AWV)  08/19/2024   MAMMOGRAM  12/02/2024   Colonoscopy  10/30/2025    DTaP/Tdap/Td (2 - Td or Tdap) 09/14/2026   Pneumonia Vaccine 49+ Years old  Completed   DEXA SCAN  Completed   Hepatitis C Screening  Completed   HPV VACCINES  Aged Out   Meningococcal B Vaccine  Aged Out   COVID-19 Vaccine  Discontinued   Zoster Vaccines- Shingrix  Discontinued    Health Maintenance  There are no preventive care reminders to display for this patient. Health Maintenance Items Addressed:  Additional Screening:  Vision Screening: Recommended annual ophthalmology exams for early detection of glaucoma and other disorders of the eye.  Dental Screening: Recommended annual dental exams for proper oral hygiene  Community Resource Referral / Chronic Care Management: CRR required this visit?  No   CCM required this visit?  No     Plan:     I have personally reviewed and noted the following in the patient's chart:   Medical and social history Use of alcohol, tobacco or illicit drugs  Current medications and supplements including opioid prescriptions. Patient is not currently taking opioid prescriptions. Functional ability and status Nutritional status Physical activity Advanced directives List of other physicians Hospitalizations, surgeries, and ER visits in previous 12 months Vitals Screenings to include  cognitive, depression, and falls Referrals and appointments  In addition, I have reviewed and discussed with patient certain preventive protocols, quality metrics, and best practice recommendations. A written personalized care plan for preventive services as well as general preventive health recommendations were provided to patient.     Freeda Jerry, [place]   08/20/2023   After Visit Summary: (MyChart) Due to this being a telephonic visit, the after visit summary with patients personalized plan was offered to patient via MyChart   Notes: Nothing significant to report at this time.

## 2023-09-11 ENCOUNTER — Ambulatory Visit (HOSPITAL_BASED_OUTPATIENT_CLINIC_OR_DEPARTMENT_OTHER)
Admission: RE | Admit: 2023-09-11 | Discharge: 2023-09-11 | Disposition: A | Discharge status: Home / Self Care | Payer: Self-pay | Source: Ambulatory Visit | Attending: Family Medicine | Admitting: Family Medicine

## 2023-09-11 DIAGNOSIS — Z8249 Family history of ischemic heart disease and other diseases of the circulatory system: Secondary | ICD-10-CM | POA: Insufficient documentation

## 2023-09-11 DIAGNOSIS — I517 Cardiomegaly: Secondary | ICD-10-CM | POA: Insufficient documentation

## 2023-09-12 ENCOUNTER — Ambulatory Visit: Payer: Self-pay | Admitting: Family Medicine

## 2023-09-15 DIAGNOSIS — Z800 Family history of malignant neoplasm of digestive organs: Secondary | ICD-10-CM | POA: Insufficient documentation

## 2023-09-15 DIAGNOSIS — Z860100 Personal history of colon polyps, unspecified: Secondary | ICD-10-CM | POA: Insufficient documentation

## 2023-12-18 DIAGNOSIS — M1612 Unilateral primary osteoarthritis, left hip: Secondary | ICD-10-CM | POA: Diagnosis not present

## 2024-01-15 ENCOUNTER — Ambulatory Visit: Payer: Medicare HMO | Admitting: Family Medicine

## 2024-01-15 ENCOUNTER — Encounter: Payer: Self-pay | Admitting: Family Medicine

## 2024-01-15 VITALS — BP 124/78 | HR 69 | Temp 98.3°F | Ht 68.0 in | Wt 177.0 lb

## 2024-01-15 DIAGNOSIS — E559 Vitamin D deficiency, unspecified: Secondary | ICD-10-CM

## 2024-01-15 DIAGNOSIS — Z0000 Encounter for general adult medical examination without abnormal findings: Secondary | ICD-10-CM

## 2024-01-15 DIAGNOSIS — Z131 Encounter for screening for diabetes mellitus: Secondary | ICD-10-CM

## 2024-01-15 DIAGNOSIS — Z1231 Encounter for screening mammogram for malignant neoplasm of breast: Secondary | ICD-10-CM

## 2024-01-15 DIAGNOSIS — Z803 Family history of malignant neoplasm of breast: Secondary | ICD-10-CM

## 2024-01-15 DIAGNOSIS — Z13220 Encounter for screening for lipoid disorders: Secondary | ICD-10-CM

## 2024-01-15 DIAGNOSIS — Z23 Encounter for immunization: Secondary | ICD-10-CM

## 2024-01-15 DIAGNOSIS — Z860100 Personal history of colon polyps, unspecified: Secondary | ICD-10-CM

## 2024-01-15 LAB — TSH: TSH: 1.14 u[IU]/mL (ref 0.35–5.50)

## 2024-01-15 LAB — VITAMIN D 25 HYDROXY (VIT D DEFICIENCY, FRACTURES): VITD: 41.57 ng/mL (ref 30.00–100.00)

## 2024-01-15 LAB — CBC
HCT: 37.8 % (ref 36.0–46.0)
Hemoglobin: 12.3 g/dL (ref 12.0–15.0)
MCHC: 32.5 g/dL (ref 30.0–36.0)
MCV: 84.4 fl (ref 78.0–100.0)
Platelets: 244 K/uL (ref 150.0–400.0)
RBC: 4.48 Mil/uL (ref 3.87–5.11)
RDW: 14.5 % (ref 11.5–15.5)
WBC: 3.3 K/uL — ABNORMAL LOW (ref 4.0–10.5)

## 2024-01-15 LAB — COMPREHENSIVE METABOLIC PANEL WITH GFR
ALT: 12 U/L (ref 0–35)
AST: 16 U/L (ref 0–37)
Albumin: 4.3 g/dL (ref 3.5–5.2)
Alkaline Phosphatase: 59 U/L (ref 39–117)
BUN: 19 mg/dL (ref 6–23)
CO2: 30 meq/L (ref 19–32)
Calcium: 9.9 mg/dL (ref 8.4–10.5)
Chloride: 107 meq/L (ref 96–112)
Creatinine, Ser: 0.84 mg/dL (ref 0.40–1.20)
GFR: 68.91 mL/min (ref >60.00)
Glucose, Bld: 97 mg/dL (ref 70–99)
Potassium: 4.8 meq/L (ref 3.5–5.1)
Sodium: 143 meq/L (ref 135–145)
Total Bilirubin: 0.5 mg/dL (ref 0.2–1.2)
Total Protein: 7 g/dL (ref 6.0–8.3)

## 2024-01-15 LAB — LIPID PANEL
Cholesterol: 250 mg/dL — ABNORMAL HIGH (ref 0–200)
HDL: 105.8 mg/dL (ref >39.00)
LDL Cholesterol: 132 mg/dL — ABNORMAL HIGH (ref 0–99)
NonHDL: 144.14
Total CHOL/HDL Ratio: 2
Triglycerides: 62 mg/dL (ref 0.0–149.0)
VLDL: 12.4 mg/dL (ref 0.0–40.0)

## 2024-01-15 LAB — HEMOGLOBIN A1C: Hgb A1c MFr Bld: 6.1 % (ref 4.6–6.5)

## 2024-01-15 MED ORDER — VITAMIN D (ERGOCALCIFEROL) 1.25 MG (50000 UNIT) PO CAPS: 50000.0000 [IU] | ORAL_CAPSULE | ORAL | 3 refills | Status: AC

## 2024-01-15 MED ORDER — FLUTICASONE PROPIONATE 50 MCG/ACT NA SUSP: 1.0000 | Freq: Two times a day (BID) | NASAL | 11 refills | Status: AC

## 2024-01-15 NOTE — Progress Notes (Signed)
 Patient ID: Sheri Kelly, female  DOB: [birth year], 73 y.o.   MRN: [id number] Patient Care Team    Relationship Specialty Notifications Start End  Sheri Kelly LABOR, DO PCP - General Family Medicine  09/25/16   Sheri Slough, MD Consulting Physician Obstetrics and Gynecology  09/25/16   Sheri Kelly, OD  Optometry  09/25/16    Comment: [facility]  Sheri Lerner, MD Consulting Physician Orthopedic Surgery  09/26/17   Sheri Dover, MD Consulting Physician Gastroenterology  07/14/20     Chief Complaint  Patient presents with   Annual Exam    Pt is fasting.  Mammogram bus-  breast center Influenza vaccine- given    Subjective:  Sheri Kelly is a 72 y.o.  Female  present for CPE  All past medical history, surgical history, allergies, family history, immunizations, medications and social history were updated in the electronic medical record today. All recent labs, ED visits and hospitalizations within the last year were reviewed.  Health maintenance:  Colonoscopy: Sheri Kelly, 10/2022, rpt 3 yrs Mammogram: Fhx present in mother, mammogram 11/2022, normal.> -BCGSO ordered today Immunizations: tdap UTD 09/2016.  PNA series completed.  Influenza vaccine given today.  Shingrix completed.  Infectious disease screening: Hep C completed Dexa: 11/03/2020> NORMAL. No further testing recommnedned.  Assistive device: none Oxygen [id number] Patient has a Dental home. Hospitalizations/ED visits: reviewed      08/20/2023   10:30 AM 07/21/2023    9:01 AM 01/13/2023   10:28 AM 08/14/2022   11:23 AM 07/05/2021    2:02 PM  Depression screen PHQ 2/9  Decreased Interest 0 0 0 0 0  Down, Depressed, Hopeless 2 0 0 0 0  PHQ - 2 Score 2 0 0 0 0  Altered sleeping 0 0     Tired, decreased energy 0 0     Change in appetite 0 0     Feeling bad or failure about yourself  0 0     Trouble concentrating 1 0     Moving slowly or fidgety/restless 0 0     Suicidal thoughts 0 0     PHQ-9 Score 3 0      Difficult doing work/chores Not difficult at all Not difficult at all         07/21/2023    9:01 AM  GAD 7 : Generalized Anxiety Score  Nervous, Anxious, on Edge 0  Control/stop worrying 0  Worry too much - different things 0  Trouble relaxing 0  Restless 0  Easily annoyed or irritable 0  Afraid - awful might happen 0  Total GAD 7 Score 0  Anxiety Difficulty Not difficult at all      01/26/2022   11:41 PM 08/14/2022   11:25 AM 01/13/2023   10:28 AM 07/21/2023    9:01 AM 08/20/2023   10:28 AM  Fall Risk  Falls in the past year?  0 0 0 0  Was there an injury with Fall?  0 0  0  Fall Risk Category Calculator  0 0  0  (RETIRED) Patient Fall Risk Level Low fall risk       Patient at Risk for Falls Due to  Impaired vision No Fall Risks  No Fall Risks  Fall risk Follow up  Falls prevention discussed Falls evaluation completed Falls evaluation completed Falls prevention discussed;Falls evaluation completed     Data saved with a previous flowsheet row definition    Immunization History  Administered Date(s) Administered   Fluad  Quad(high Dose 65+) 01/08/2021, 01/09/2022   Fluad Trivalent(High Dose 65+) 01/13/2023   INFLUENZA, HIGH DOSE SEASONAL PF 02/24/2018, 01/15/2024   Influenza,inj,quad, With Preservative 11/13/2016   Influenza-Unspecified 12/15/2018   PFIZER(Purple Top)SARS-COV-2 Vaccination 05/07/2019, 05/28/2019, 12/14/2019, 02/24/2020   Pfizer Covid-19 Vaccine Bivalent Booster 108yrs & up 04/23/2021   Pneumococcal Conjugate-13 09/25/2016   Pneumococcal Polysaccharide-23 10/02/2018   Tdap 09/13/2016   Zoster Recombinant(Shingrix) 11/29/2020    Past Medical History:  Diagnosis Date   Anemia 07/01/2019   Arthritis    Cervical dysplasia    Cervical polyp 01/14/2008   Complication of anesthesia    had ileus after hysterectomy 2011   History of breast reconstruction    Bilateral    History of chicken pox    History of measles, mumps, or rubella    Infertility, female     Menopausal symptoms    Sleep disturbance 12/03/2017   Trigger finger of all digits of right hand    RMF   Uterine fibroid    Allergies  Allergen Reactions   Dust Mite Extract    Molds & Smuts    Past Surgical History:  Procedure Laterality Date   ABDOMINAL HYSTERECTOMY  09/19/2009-09/22/2009   total   BREAST SURGERY  1998   breast lift   CESAREAN SECTION      x 2   INTRAOCULAR LENS INSERTION Bilateral    MOUTH SURGERY     SALPINGOOPHORECTOMY  09/19/2009   right   TRIGGER FINGER RELEASE Right 05/12/2017   Procedure: RIGHT TRIGGER FINGER RELEASE;  Surgeon: Sheri Lung, MD;  Location: [facility];  Service: Plastics;  Laterality: Right;   TUBAL LIGATION     bilateral   UTERINE FIBROID SURGERY     Family History  Problem Relation Age of Onset   Arthritis Mother    Breast cancer Mother    Kelly cancer Father    Hypertension Brother    Throat cancer Brother    Leukemia Sister    Hypertension Son    Social History   Social History Narrative   Divorced. 1 child (Sheri Kelly).   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    Drinks caffeine, uses herbal remedies. Takes a daily vitamin.    Wears seatbelt, bicycle helmet and dentures.    Smoke detector in the home. Firearms locked in the home.    Feels safe in her relationships.     Allergies as of 01/15/2024       Reactions   Dust Mite Extract    Molds & Smuts         Medication List        Accurate as of January 15, 2024 11:00 AM. If you have any questions, ask your nurse or doctor.          fluticasone  50 MCG/ACT nasal spray Commonly known as: FLONASE  Place 1 spray into both nostrils 2 (two) times daily.   VITAMIN A PO Take by mouth.   VITAMIN C PO Take by mouth.   Vitamin D  (Ergocalciferol ) 1.25 MG (50000 UNIT) Caps capsule Commonly known as: DRISDOL  Take 1 capsule (50,000 Units total) by mouth every 7 (seven) days.        All past medical history, surgical history, allergies, family history,  immunizations andmedications were updated in the EMR today and reviewed under the history and medication portions of their EMR.      Review of Systems  All other systems reviewed and are negative.  14 pt review of systems  performed and negative (unless mentioned in an HPI)  Objective: BP 124/78   Pulse 69   Temp 98.3 F (36.8 C)   Ht 5' 8 (1.727 m)   Wt 177 lb (80.3 kg)   SpO2 97%   BMI 26.91 kg/m  Physical Exam Vitals and nursing note reviewed.  Constitutional:      General: She is not in acute distress.    Appearance: Normal appearance. She is not ill-appearing or toxic-appearing.  HENT:     Head: Normocephalic and atraumatic.     Right Ear: Tympanic membrane, ear canal and external ear normal. There is no impacted cerumen.     Left Ear: Tympanic membrane, ear canal and external ear normal. There is no impacted cerumen.     Nose: No congestion or rhinorrhea.     Mouth/Throat:     Mouth: Mucous membranes are moist.     Pharynx: Oropharynx is clear. No oropharyngeal exudate or posterior oropharyngeal erythema.  Eyes:     General: No scleral icterus.       Right eye: No discharge.        Left eye: No discharge.     Extraocular Movements: Extraocular movements intact.     Conjunctiva/sclera: Conjunctivae normal.     Pupils: Pupils are equal, round, and reactive to light.  Cardiovascular:     Rate and Rhythm: Normal rate and regular rhythm.     Pulses: Normal pulses.     Heart sounds: Normal heart sounds. No murmur heard.    No friction rub. No gallop.  Pulmonary:     Effort: Pulmonary effort is normal. No respiratory distress.     Breath sounds: Normal breath sounds. No stridor. No wheezing, rhonchi or rales.  Chest:     Chest wall: No tenderness.  Abdominal:     General: Abdomen is flat. Bowel sounds are normal. There is no distension.     Palpations: Abdomen is soft. There is no mass.     Tenderness: There is no abdominal tenderness. There is no right CVA  tenderness, left CVA tenderness, guarding or rebound.     Hernia: No hernia is present.  Musculoskeletal:        General: No swelling, tenderness or deformity. Normal range of motion.     Cervical back: Normal range of motion and neck supple. No rigidity or tenderness.     Right lower leg: No edema.     Left lower leg: No edema.  Lymphadenopathy:     Cervical: No cervical adenopathy.  Skin:    General: Skin is warm and dry.     Coloration: Skin is not jaundiced or pale.     Findings: No bruising, erythema, lesion or rash.  Neurological:     General: No focal deficit present.     Mental Status: She is alert and oriented to person, place, and time. Mental status is at baseline.     Cranial Nerves: No cranial nerve deficit.     Sensory: No sensory deficit.     Motor: No weakness.     Coordination: Coordination normal.     Gait: Gait normal.     Deep Tendon Reflexes: Reflexes normal.  Psychiatric:        Mood and Affect: Mood normal.        Behavior: Behavior normal.        Thought Content: Thought content normal.        Judgment: Judgment normal.    No results found.  Assessment/plan:  Sheri Kelly is a 73 y.o. female present for CPE and chronic condition appointment Vitamin D  deficiency Vitamin D  collected today Continue supplement prescribed Breast cancer screening by mammogram/Family history of breast cancer - MM 3D SCREENING MAMMOGRAM BILATERAL BREAST; Future Influenza vaccine needed - Flu vaccine HIGH DOSE PF(Fluzone Trivalent) Lipid screening - Lipid panel Diabetes mellitus screening - Hemoglobin A1c Vitamin D  deficiency - Vitamin D  (25 hydroxy) History of colonic polyps UTD Routine general medical examination at a health care facility Colonoscopy: Sheri Kelly, 10/2022, rpt 3 yrs Mammogram: Fhx present in mother, mammogram 11/2022, normal.> -BCGSO ordered today Immunizations: tdap UTD 09/2016.  PNA series completed.  Influenza vaccine given today.  Shingrix completed.   Infectious disease screening: Hep C completed Dexa: 11/03/2020> NORMAL. No further testing recommnedned.  Patient was encouraged to exercise greater than 150 minutes a week. Patient was encouraged to choose a diet filled with fresh fruits and vegetables, and lean meats. AVS provided to patient today for education/recommendation on gender specific health and safety maintenance. Labs collected today  Return in about 1 year (around 01/15/2025) for cpe (20 min).  Orders Placed This Encounter  Procedures   MM 3D SCREENING MAMMOGRAM BILATERAL BREAST   Flu vaccine HIGH DOSE PF(Fluzone Trivalent)   CBC   Comprehensive metabolic panel with GFR   Hemoglobin A1c   Lipid panel   TSH   Vitamin D  (25 hydroxy)   Meds ordered this encounter  Medications   Vitamin D , Ergocalciferol , (DRISDOL ) 1.25 MG (50000 UNIT) CAPS capsule    Sig: Take 1 capsule (50,000 Units total) by mouth every 7 (seven) days.    Dispense:  12 capsule    Refill:  3   fluticasone  (FLONASE ) 50 MCG/ACT nasal spray    Sig: Place 1 spray into both nostrils 2 (two) times daily.    Dispense:  16 g    Refill:  11   Referral Orders  No referral(s) requested today     Electronically signed by: Kelly Bellini, DO [facility]

## 2024-01-15 NOTE — Patient Instructions (Addendum)

## 2024-01-16 ENCOUNTER — Ambulatory Visit: Payer: Self-pay | Admitting: Family Medicine

## 2024-01-27 ENCOUNTER — Ambulatory Visit
Admission: RE | Admit: 2024-01-27 | Discharge: 2024-01-27 | Disposition: A | Discharge status: Home / Self Care | Source: Ambulatory Visit | Attending: Family Medicine | Admitting: Family Medicine

## 2024-01-27 DIAGNOSIS — Z1231 Encounter for screening mammogram for malignant neoplasm of breast: Secondary | ICD-10-CM

## 2024-02-23 ENCOUNTER — Telehealth: Payer: Self-pay

## 2024-02-23 NOTE — Telephone Encounter (Signed)
 Her last bone density was in 2022. Her bone density was normal in 2022. Typically repeat bone densities every 2-3 years in people who have increased bone density condition like osteopenia or osteoporosis, which she has not been diagnosed with. With normal bone densities we typically follow-up every 5 years.   That being said, she would like to have a bone density completed we can place lab order for her.  Please ask her where she would want to have it, the breast center no longer does bone densities. Please place order for her at location desires with the diagnostic code of estrogen deficiency and vitamin D  deficiency.

## 2024-02-23 NOTE — Telephone Encounter (Signed)
 Did the patient discuss referral with their provider in the last year? Yes    (If No - schedule appointment)    (If Yes - send message)        Appointment offered? Yes, but states she just saw her PCP a month ago for physical.        Type of order/referral and detailed reason for visit: Imaging - bone density scan, states she feels like she is due for one.        Preference of office, provider, location: [facility]    [address], [place], [place] [address]        If referral order, have you been seen by this specialty before? Yes    (If Yes, this issue or another issue? When? Where?        Can we respond through MyChart? Yes

## 2024-02-23 NOTE — Telephone Encounter (Signed)
 LVM to discuss

## 2024-03-13 DIAGNOSIS — F4323 Adjustment disorder with mixed anxiety and depressed mood: Secondary | ICD-10-CM | POA: Diagnosis not present

## 2024-03-15 DIAGNOSIS — H43393 Other vitreous opacities, bilateral: Secondary | ICD-10-CM | POA: Diagnosis not present

## 2024-03-15 DIAGNOSIS — H2513 Age-related nuclear cataract, bilateral: Secondary | ICD-10-CM | POA: Diagnosis not present

## 2024-03-17 DIAGNOSIS — F4323 Adjustment disorder with mixed anxiety and depressed mood: Secondary | ICD-10-CM | POA: Diagnosis not present

## 2024-03-24 DIAGNOSIS — F4323 Adjustment disorder with mixed anxiety and depressed mood: Secondary | ICD-10-CM | POA: Diagnosis not present

## 2024-04-03 DIAGNOSIS — F4323 Adjustment disorder with mixed anxiety and depressed mood: Secondary | ICD-10-CM | POA: Diagnosis not present

## 2024-04-26 ENCOUNTER — Ambulatory Visit: Payer: Self-pay

## 2024-04-26 NOTE — Telephone Encounter (Signed)
 FYI Only or Action Required?: FYI only for provider: appointment scheduled on 04/27/24.alternative office   Patient was last seen in primary care on 01/15/2024 by Catherine Fuller A, DO.  Called Nurse Triage reporting Rash and Blister.  Symptoms began last Monday .  Interventions attempted: OTC medications: calamine lotion .  Symptoms are: stable.  Triage Disposition: See Physician Within 24 Hours  Patient/caregiver understands and will follow disposition?: Yes               Reason for Disposition  [1] Multiple small blisters grouped together in one area of body (i.e., dermatomal distribution or band or stripe) AND [2] painful  Answer Assessment - Initial Assessment Questions Patient reports last Monday noticed tingling no severe pain, looked in mirror saw tiny blisters, got in shower and took hot shower and hot shower made them burst, has been putting calamine lotion scabbing over, sore as the dikens  4/10. size of 50 cent . wondering if shingles and if not not sure. Only on right side circle patch not spreading.  It looks like wants to heal, can't sit on causes more pain. Increased stress. Would like to be seen any office female provider booked [facility] [place] tomorrow 1:05 PM per pt request patient aware of location/address. PCP office did not have female provider opening tomorrow     1. APPEARANCE of SORES: What do the sores look like?     Blistering on right buttocks now scabbed over  2. NUMBER: How many sores are there?     Several clustered together  3. SIZE: How big is the largest sore?     Area size of 50 cent piece  4. LOCATION: Where are the sores located?     Right buttocks localized  5. ONSET: When did the sores begin?     Last Monday  6. TENDER: Does it hurt when you touch it?  (Scale 1-10; or mild, moderate, severe)      4/10 pain level  7. CAUSE: What do you think is causing the sores?     Unsure but concerned its shingles  got 1 shingrix vaccine but not the 2nd one 8. OTHER SYMPTOMS: Do you have any other symptoms? (e.g., fever, new weakness)     Patient denies the following chest pain, shortness of breath, fever  Protocols used: Sores-A-AH Copied from CRM #[id number]. Topic: Clinical - Red Word Triage >> Apr 26, 2024  2:46 PM Rea ORN wrote: Red Word that prompted transfer to Nurse Triage: Possible shingles on buttocks. Tingling

## 2024-04-27 ENCOUNTER — Encounter: Payer: Self-pay | Admitting: Medical-Surgical

## 2024-04-27 ENCOUNTER — Ambulatory Visit: Admitting: Medical-Surgical

## 2024-04-27 VITALS — BP 105/63 | HR 65 | Temp 97.9°F | Resp 20 | Ht 67.0 in | Wt 178.0 lb

## 2024-04-27 DIAGNOSIS — B029 Zoster without complications: Secondary | ICD-10-CM | POA: Diagnosis not present

## 2024-04-27 NOTE — Progress Notes (Signed)
" ° °       Established patient visit   History of Present Illness   Discussed the use of AI scribe software for clinical note transcription with the patient, who gave verbal consent to proceed.  History of Present Illness   Sheri Kelly is a 74 year old female who presents with a painful rash on her buttocks, suspected to be shingles.  Vesicular rash and pain - Onset of blisters on buttocks last Monday, initially noticed in the shower - Blisters ruptured during a hot shower - Rash remains localized to one area on the buttocks - Pain described as tingling and severe yesterday, preventing sitting and sleep - Pain improved today with residual focal discomfort - Pain rated approximately 2/10, more irritating than painful - No associated itching, fever, or chills - No prior history of herpes or recent sexual activity  Symptom management - Applied calamine lotion after blisters ruptured - Has lidocaine  ointment at home but has not used it so far  Psychological stress - High stress this year, believed to contribute to outbreak - Practicing mindfulness and walking for stress management    Physical Exam   Physical Exam Vitals reviewed.  Constitutional:      General: She is not in acute distress.    Appearance: Normal appearance. She is not ill-appearing.  HENT:     Head: Normocephalic and atraumatic.  Cardiovascular:     Rate and Rhythm: Normal rate and regular rhythm.     Pulses: Normal pulses.     Heart sounds: Normal heart sounds. No murmur heard.    No friction rub. No gallop.  Pulmonary:     Effort: Pulmonary effort is normal. No respiratory distress.     Breath sounds: Normal breath sounds. No wheezing.  Skin:    General: Skin is warm and dry.     Findings: Rash (Right buttock, scabbed, peeling, healing appropriately) present.  Neurological:     Mental Status: She is alert and oriented to person, place, and time.  Psychiatric:        Mood and Affect: Mood normal.         Behavior: Behavior normal.        Thought Content: Thought content normal.        Judgment: Judgment normal.    Assessment & Plan     Herpes zoster (shingles) Herpes zoster with healing rash on buttocks, no new blisters, no inflammation. Discussed recurrence risk and vaccination importance. Advised on contagion precautions. - Continue Calamine lotion for rash. - Apply lidocaine  ointment for tingling and discomfort. - Consider lidocaine  patch if preferred. - Monitor for new blisters or worsening symptoms. - Administer second shingles vaccine once fully recovered.     Follow up   Return if symptoms worsen or fail to improve. __________________________________ Zada FREDRIK Palin, DNP, APRN, FNP-BC Primary Care and Sports Medicine [facility] [place] "

## 2024-04-27 NOTE — Patient Instructions (Signed)
 Shingles  Shingles, or herpes zoster, is an infection. It gives you a skin rash and blisters. These infected areas may hurt a lot. Shingles only happens if: You've had chickenpox. You've been given a shot called a vaccine to protect you from getting chickenpox. Shingles is rare in this case. What are the causes? Shingles is caused by a germ called the varicella-zoster virus. This is the same germ that causes chickenpox. After you're exposed to the germ, it stays in your body but is dormant. This means it isn't active. Shingles happens if the germ becomes active again. This can happen years after you're first exposed to the germ. What increases the risk? You may be more likely to get shingles if: You're older than 74 years of age. You're under a lot of stress. You have a weak immune system. The immune system is your body's defense system. It may be weak if: You have human immunodeficiency virus (HIV). You have acquired immunodeficiency syndrome (AIDS). You have cancer. You take medicines that weaken your immune system. These include organ transplant medicines. What are the signs or symptoms? The first symptoms of shingles may be itching, tingling, or pain. Your skin may feel like it's burning. A few days or weeks later, you'll get a rash. Here's what you can expect: The rash is likely to be on one side of your body. The rash may be shaped like a belt or a band. Over time, it will turn into blisters filled with fluid. The blisters will break open and change into scabs. The scabs will dry up in about 2-3 weeks. You may also have: A fever. Chills. A headache. Nausea. How is this diagnosed? Shingles is diagnosed with a skin exam. A sample called a culture may be taken from one of your blisters and sent to a lab. This will show if you have shingles. How is this treated? The rash may last for several weeks. There's no cure for shingles, but your health care provider may give you medicines.  These medicines may: Help with pain. Help with itching. Help with irritation and swelling. Help you get better sooner. Help to prevent long-term problems. If the rash is on your face, you may need to see an eye doctor or an ear, nose, and throat (ENT) doctor. Follow these instructions at home: Medicines Take your medicines only as told by your provider. Put an anti-itch cream or numbing cream on the rash or blisters as told by your provider. Relieving itching and discomfort  To help with itching: Put cold, wet cloths called cold compresses on the rash or blisters. Take a cool bath. Try adding baking soda or dry oatmeal to the water. Do not bathe in hot water. Use calamine lotion on the rash or blisters. You can get this type of lotion at the store. Blister and rash care Keep your rash covered with a loose bandage. Wear loose clothes that don't rub on your rash. Take care of your rash as told by your provider. Make sure you: Wash your hands with soap and water for at least 20 seconds before and after you change your bandage. If you can't use soap and water, use hand sanitizer. Keep your rash and blisters clean by washing them with mild soap and cool water. Change your bandage. Check your rash every day for signs of infection. Check for: More redness, swelling, or pain. Fluid or blood. Warmth. Pus or a bad smell. Do not scratch your rash. Do not pick at your  blisters. To help you not scratch: Keep your fingernails clean and cut short. Try to wear gloves or mittens when you sleep. General instructions Rest. Wash your hands often with soap and water for at least 20 seconds. If you can't use soap and water, use hand sanitizer. Washing your hands lowers your chance of getting a skin infection. Your infection can cause chickenpox in others. If you have blisters that aren't scabs yet, stay away from: Babies. Pregnant people. Children who have eczema. Older people who have organ  transplants. People who have a long-term, or chronic, illness. Anyone who hasn't had chickenpox before. Anyone who hasn't gotten the chickenpox vaccine. How is this prevented? Vaccines are the best way to prevent you from getting chickenpox or shingles. Talk with your provider about getting these shots. Where to find more information Centers for Disease Control and Prevention (CDC): [employer].no Contact a health care provider if: Your pain doesn't get better with medicine. Your pain doesn't get better after the rash heals. You have any signs of infection around the rash. Your rash or blisters get worse. You have a fever or chills. Get help right away if: The rash is on your face or nose. You have pain in your face or by your eye. You lose feeling on one side of your face. You have trouble seeing. You have ear pain or ringing in your ear. This information is not intended to replace advice given to you by your health care provider. Make sure you discuss any questions you have with your health care provider. Document Revised: 01/02/2023 Document Reviewed: 05/17/2022 Elsevier Patient Education  2024 [employer].

## 2024-08-25 ENCOUNTER — Ambulatory Visit

## 2025-01-17 ENCOUNTER — Encounter: Admitting: Family Medicine
